# Patient Record
Sex: Female | Born: 1965 | Race: White | Hispanic: No | Marital: Married | Smoking: Never smoker
Health system: Southern US, Community
[De-identification: ages and names within clinical notes are randomized; demographics above are authoritative.]

## PROBLEM LIST (undated history)

## (undated) DIAGNOSIS — E785 Hyperlipidemia, unspecified: Secondary | ICD-10-CM

## (undated) DIAGNOSIS — I10 Essential (primary) hypertension: Secondary | ICD-10-CM

## (undated) HISTORY — PX: NO PAST SURGERIES: SHX2092

---

## 1997-11-26 ENCOUNTER — Other Ambulatory Visit: Admission: RE | Admit: 1997-11-26 | Discharge: 1997-11-26 | Payer: Self-pay | Admitting: *Deleted

## 2005-07-08 ENCOUNTER — Ambulatory Visit: Payer: Self-pay | Admitting: Family Medicine

## 2007-07-14 ENCOUNTER — Encounter: Payer: Self-pay | Admitting: Family Medicine

## 2010-08-12 NOTE — Letter (Signed)
Summary: rpc chart  rpc chart   Imported By: Curtis Sites 04/22/2010 17:19:45  _____________________________________________________________________  External Attachment:    Type:   Image     Comment:   External Document

## 2014-10-25 ENCOUNTER — Ambulatory Visit (INDEPENDENT_AMBULATORY_CARE_PROVIDER_SITE_OTHER): Payer: Self-pay | Admitting: Orthopedic Surgery

## 2014-10-25 VITALS — BP 125/83 | Ht 64.0 in | Wt 292.0 lb

## 2014-10-25 DIAGNOSIS — M1711 Unilateral primary osteoarthritis, right knee: Secondary | ICD-10-CM

## 2014-10-25 DIAGNOSIS — S83241A Other tear of medial meniscus, current injury, right knee, initial encounter: Secondary | ICD-10-CM

## 2014-10-25 NOTE — Progress Notes (Signed)
Patient ID: Jaclyn Klein, female   DOB: 05-11-1966, 49 y.o.   MRN: [id number] Subjective:    Jaclyn Klein is a 49 y.o. female who presents with knee pain involving the right knee. Onset was gradual, starting about several weeks ago. Inciting event: none known. Current symptoms include: crepitus sensation and pain located MEDIAL JOINT LINE . Pain is aggravated by any weight bearing, going up and down stairs, rising after sitting, standing and walking. Patient has had no prior knee problems. 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 Treatment to date: avoidance of offending activity and OTC analgesics which are not very effective.  No past medical history on file.  No past surgical history on file.  No family history on file.  Social History History  Substance Use Topics  . Smoking status: Not on file  . Smokeless tobacco: Not on file  . Alcohol Use: Not on file    Allergies not on file  Current Outpatient Prescriptions  Medication Sig Dispense Refill  . atorvastatin (LIPITOR) 20 MG tablet Take 20 mg by mouth daily.    Marland Kitchen. levothyroxine (SYNTHROID, LEVOTHROID) 50 MCG tablet Take 50 mcg by mouth daily before breakfast.    . lisinopril-hydrochlorothiazide (PRINZIDE,ZESTORETIC) 20-12.5 MG per tablet Take 2 tablets by mouth daily.    . Norethindrone (LYZA PO) Take by mouth.     No current facility-administered medications for this visit.      Review of Systems Constitutional: negative for fevers Integument/breast: negative for rash Musculoskeletal:negative for back pain Neurological: negative for paresthesia   Objective:    BP 125/83 mmHg  Ht 5\' 4"  (1.626 m)  Wt 292 lb (132.45 kg)  BMI 50.10 kg/m2 GENERAL NORMAL GROOMING  ORIENTATION NORMAL  MOOD NORMAL   UPPEREXTREMITIES: NORMAL   Right knee: positive exam findings: crepitus, medial joint line tenderness and ROM = 125 and negative exam findings: no effusion, no erythema, ACL stable, PCL  stable, MCL stable, LCL stable, no patellar laxity, McMurray's negative and no crepitus  Left knee:  normal and no effusion, full active range of motion, no joint line tenderness, ligamentous structures intact.   SKIN NORMAL   CV NORMAL   SENSATION NORMAL   COORDINATION BALANCE NORMAL     Assessment:   X-rays were from an outside facility on a disc and I could not get the axial view to show but the report reads severe patellofemoral arthritis the medial compartment had very minimal arthritis Plan:  The patient does not have insurance and is a self-pay she is interested in giving her cost down  I would love to get an MRI this knee. However we settled on an injection and see how that does first.  I think she has a torn medial meniscus patellofemoral arthritis is not symptomatic  Procedure note right knee injection verbal consent was obtained to inject right knee joint  Timeout was completed to confirm the site of injection  The medications used were 40 mg of Depo-Medrol and 1% lidocaine 3 cc  Anesthesia was provided by ethyl chloride and the skin was prepped with alcohol.  After cleaning the skin with alcohol a 20-gauge needle was used to inject the right knee joint. There were no complications. A sterile bandage was applied.

## 2014-10-25 NOTE — Patient Instructions (Signed)
Joint Injection  Care After  Refer to this sheet in the next few days. These instructions provide you with information on caring for yourself after you have had a joint injection. Your caregiver also may give you more specific instructions. Your treatment has been planned according to current medical practices, but problems sometimes occur. Call your caregiver if you have any problems or questions after your procedure.  After any type of joint injection, it is not uncommon to experience:  · Soreness, swelling, or bruising around the injection site.  · Mild numbness, tingling, or weakness around the injection site caused by the numbing medicine used before or with the injection.  It also is possible to experience the following effects associated with the specific agent after injection:  · Iodine-based contrast agents:  ¨ Allergic reaction (itching, hives, widespread redness, and swelling beyond the injection site).  · Corticosteroids (These effects are rare.):  ¨ Allergic reaction.  ¨ Increased blood sugar levels (If you have diabetes and you notice that your blood sugar levels have increased, notify your caregiver).  ¨ Increased blood pressure levels.  ¨ Mood swings.  · Hyaluronic acid in the use of viscosupplementation.  ¨ Temporary heat or redness.  ¨ Temporary rash and itching.  ¨ Increased fluid accumulation in the injected joint.  These effects all should resolve within a day after your procedure.   HOME CARE INSTRUCTIONS  · Limit yourself to light activity the day of your procedure. Avoid lifting heavy objects, bending, stooping, or twisting.  · Take prescription or over-the-counter pain medication as directed by your caregiver.  · You may apply ice to your injection site to reduce pain and swelling the day of your procedure. Ice may be applied 03-04 times:  ¨ Put ice in a plastic bag.  ¨ Place a towel between your skin and the bag.  ¨ Leave the ice on for no longer than 15-20 minutes each time.  SEEK  IMMEDIATE MEDICAL CARE IF:   · Pain and swelling get worse rather than better or extend beyond the injection site.  · Numbness does not go away.  · Blood or fluid continues to leak from the injection site.  · You have chest pain.  · You have swelling of your face or tongue.  · You have trouble breathing or you become dizzy.  · You develop a fever, chills, or severe tenderness at the injection site that last longer than 1 day.  MAKE SURE YOU:  · Understand these instructions.  · Watch your condition.  · Get help right away if you are not doing well or if you get worse.  Document Released: 03/12/2011 Document Revised: 09/21/2011 Document Reviewed: 03/12/2011  ExitCare® Patient Information ©2015 [employer]. This information is not intended to replace advice given to you by your health care provider. Make sure you discuss any questions you have with your health care provider.

## 2015-01-09 ENCOUNTER — Telehealth: Payer: Self-pay | Admitting: Orthopedic Surgery

## 2015-01-09 NOTE — Telephone Encounter (Signed)
Patient called to inquire about MRI, as per office visit 10/25/14.  States still has no insurance, but has payment "set aside." She is also checking with [facility] billing, and with the new [facility].  She will call back when ready to schedule, or we may hear from the [facility] representative, in regard to scheduling.  Patient has recently been married, last name change from "Aliene AltesByrne" to "Roorda."  Updated.  Pt's ph# is [phone]

## 2015-01-22 ENCOUNTER — Telehealth: Payer: Self-pay | Admitting: Orthopedic Surgery

## 2015-01-22 NOTE — Telephone Encounter (Signed)
Sent HIM/ROI fax as per request to patient's primary care, [facility], ph# [phone]; Fax# [phone]. Patient aware.

## 2015-01-22 NOTE — Telephone Encounter (Signed)
Patient called back and relayed that she would like to proceed with MRI, per last office visit note.  She is aware of self-pay requirement.  Please schedule.  Pt's ph# is [phone].

## 2015-01-23 ENCOUNTER — Other Ambulatory Visit: Payer: Self-pay | Admitting: *Deleted

## 2015-01-23 DIAGNOSIS — S83241A Other tear of medial meniscus, current injury, right knee, initial encounter: Secondary | ICD-10-CM

## 2015-02-05 ENCOUNTER — Ambulatory Visit (HOSPITAL_COMMUNITY)
Admission: RE | Admit: 2015-02-05 | Discharge: 2015-02-05 | Disposition: A | Discharge status: Home / Self Care | Payer: Self-pay | Source: Ambulatory Visit | Attending: Orthopedic Surgery | Admitting: Orthopedic Surgery

## 2015-02-05 DIAGNOSIS — S83241A Other tear of medial meniscus, current injury, right knee, initial encounter: Secondary | ICD-10-CM

## 2015-02-05 DIAGNOSIS — M2241 Chondromalacia patellae, right knee: Secondary | ICD-10-CM | POA: Insufficient documentation

## 2015-02-05 DIAGNOSIS — M23203 Derangement of unspecified medial meniscus due to old tear or injury, right knee: Secondary | ICD-10-CM | POA: Insufficient documentation

## 2015-02-05 DIAGNOSIS — M25561 Pain in right knee: Secondary | ICD-10-CM | POA: Insufficient documentation

## 2015-02-12 ENCOUNTER — Telehealth: Payer: Self-pay | Admitting: Orthopedic Surgery

## 2015-02-12 ENCOUNTER — Other Ambulatory Visit: Payer: Self-pay | Admitting: *Deleted

## 2015-02-12 MED ORDER — ACETAMINOPHEN-CODEINE #3 300-30 MG PO TABS: 1.0000 | ORAL_TABLET | Freq: Four times a day (QID) | ORAL | Status: DC | PRN

## 2015-02-12 MED ORDER — NAPROXEN 500 MG PO TABS: 500.0000 mg | ORAL_TABLET | Freq: Two times a day (BID) | ORAL | Status: DC

## 2015-02-12 NOTE — Telephone Encounter (Signed)
Patient returned call in response to Dr Harrison's message regarding her MRI results.  Please advise if appointment is needed, or please call at ph #[phone]

## 2015-02-12 NOTE — Telephone Encounter (Signed)
MRI - grade 4 OA TORN MED MENISCUS  IMPRESSION: 1. Complex tear of the midbody of the lateral meniscus with medial joint space narrowing and at multi focal grade 4 chondromalacia in the medial compartment. 2. 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 3. Slight chondromalacia of the lateral compartment.     Electronically Signed   By: Francene Boyers M.D.   On: 02/06/2015 08:10  NAPROSYN 500 BID # 60 TYLENOL # 3 - 1 Q6 PRN PAIN # 60

## 2015-02-12 NOTE — Telephone Encounter (Signed)
Left message

## 2015-03-05 ENCOUNTER — Other Ambulatory Visit: Payer: Self-pay | Admitting: Pain Medicine

## 2015-03-05 ENCOUNTER — Other Ambulatory Visit: Payer: Self-pay | Admitting: Orthopedic Surgery

## 2015-03-07 ENCOUNTER — Other Ambulatory Visit: Payer: Self-pay | Admitting: *Deleted

## 2015-03-07 MED ORDER — ACETAMINOPHEN-CODEINE #3 300-30 MG PO TABS: 1.0000 | ORAL_TABLET | Freq: Four times a day (QID) | ORAL | Status: DC | PRN

## 2015-03-13 ENCOUNTER — Other Ambulatory Visit: Payer: Self-pay | Admitting: Orthopedic Surgery

## 2015-03-19 ENCOUNTER — Other Ambulatory Visit: Payer: Self-pay | Admitting: *Deleted

## 2015-03-19 MED ORDER — NAPROXEN 500 MG PO TABS: 500.0000 mg | ORAL_TABLET | Freq: Two times a day (BID) | ORAL | Status: DC

## 2015-04-15 ENCOUNTER — Other Ambulatory Visit: Payer: Self-pay | Admitting: Orthopedic Surgery

## 2015-04-22 ENCOUNTER — Other Ambulatory Visit: Payer: Self-pay | Admitting: *Deleted

## 2015-04-22 MED ORDER — NAPROXEN 500 MG PO TABS: 500.0000 mg | ORAL_TABLET | Freq: Two times a day (BID) | ORAL | Status: DC

## 2015-04-25 NOTE — Telephone Encounter (Signed)
Patient is calling asking for a refill on TYLENOL # 3 - 1 Q6 PRN PAIN # 60 please advise?

## 2015-04-25 NOTE — Telephone Encounter (Signed)
OK  I THOUGHT I GOT THIS BEFORE   SHE CANT HAVE ANY MORE   ADVISE TYLENOL OR IBUPROFEN

## 2015-05-28 ENCOUNTER — Telehealth: Payer: Self-pay | Admitting: *Deleted

## 2015-07-23 ENCOUNTER — Ambulatory Visit: Payer: Self-pay | Admitting: Orthopedic Surgery

## 2015-08-06 ENCOUNTER — Ambulatory Visit (INDEPENDENT_AMBULATORY_CARE_PROVIDER_SITE_OTHER): Payer: Self-pay | Admitting: Orthopedic Surgery

## 2015-08-06 VITALS — BP 129/87 | Ht 64.0 in | Wt 277.0 lb

## 2015-08-06 DIAGNOSIS — M1711 Unilateral primary osteoarthritis, right knee: Secondary | ICD-10-CM

## 2015-08-06 DIAGNOSIS — S83241D Other tear of medial meniscus, current injury, right knee, subsequent encounter: Secondary | ICD-10-CM

## 2015-08-06 NOTE — Progress Notes (Signed)
Chief Complaint  Patient presents with  . Knee Pain    eval Rt knee, MRI results   I saw Jaclyn Klein back in 2016 she had a torn medial meniscus we discussed possible surgery but she was uninsured. She is still uninsured but did get a discount for her MRI which showed she had a torn medial meniscus and osteoarthritis of the knee. She also had some patellofemoral disease.  Prior history  Jaclyn Klein is a 50 y.o. female who presents with knee pain involving the right knee. Onset was gradual, starting about several weeks ago. Inciting event: none known. Current symptoms include: crepitus sensation and pain located MEDIAL JOINT LINE . Pain is aggravated by any weight bearing, going up and down stairs, rising after sitting, standing and walking. Patient has had no prior knee problems. 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 Treatment to date: avoidance of offending activity and OTC analgesics which are not very effective.  She reports now that her pain is worse her activity level has decreased and she wants her life back, she says she can't run and play with her granddaughter and she can't dance  Medical history of reflux hyperthyroidism hyperlipidemia hypertension  She reported no surgical history  She has a negative family history  Social history Social History  Substance Use Topics  . Smoking status: Not on file  . Smokeless tobacco: Not on file  . Alcohol Use: Not on file   BP 129/87 mmHg  Ht  (1.626 m)  Wt 277 lb (125.646 kg)  BMI 47.52 kg/m2 Gen. appearance normal grooming oriented 3 mood affect normal  She has medial joint line tenderness knee flexion 125 knee stable on range of motion mild crepitance in the patellofemoral joint painful patellofemoral compression no joint effusion. Strength normal in the right knee  Left knee normal without effusion no tenderness full range of motion ligamentous structures intact  Skin normal both  legs  Hip range of motion normal  Cardiovascular function normal sensation normal both legs  Balance normal  Data  MRI complex tear medial meniscus medial joint space narrowing multiple focal grade 4 chondromalacia medial compartment extensive grade 4 chondral malacia patella slight chondromalacia lateral compartment  Plain films show degenerative arthritis with joint space narrowing  The patient would like to try to have surgery and pain for herself  She is a torn medial meniscus was osteoarthritis medial compartment patellofemoral compartment  She understands she will have postoperative aching from her arthritis but should have improvement in overall knee function  The patient is scheduled for arthroscopy of the right knee and partial medial meniscectomy at her convenience

## 2015-08-06 NOTE — Patient Instructions (Signed)
Surgery- RIGHT KNEE ARTHROSCOPY WITH MEDIAL MENISECTOMY- 08/22/15

## 2015-08-16 NOTE — Patient Instructions (Signed)
Jaclyn Klein  08/16/2015     @   Your procedure is scheduled on  08/22/2015   Report to [facility] at  945 A.M.  Call this number if you have problems the morning of surgery:  [phone]   Remember:  Do not eat food or drink liquids after midnight.  Take these medicines the morning of surgery with A SIP OF WATER nexium, levothyroxine, lisinopril.   Do not wear jewelry, make-up or nail polish.  Do not wear lotions, powders, or perfumes.  You may wear deodorant.  Do not shave 48 hours prior to surgery.  Men may shave face and neck.  Do not bring valuables to the hospital.  [facility] is not responsible for any belongings or valuables.  Contacts, dentures or bridgework may not be worn into surgery.  Leave your suitcase in the car.  After surgery it may be brought to your room.  For patients admitted to the hospital, discharge time will be determined by your treatment team.  Patients discharged the day of surgery will not be allowed to drive home.   Name and phone number of your driver:   family Special instructions:  none  Please read over the following fact sheets that you were given. Coughing and Deep Breathing, Surgical Site Infection Prevention, Anesthesia Post-op Instructions and Care and Recovery After Surgery      Knee Arthroscopy Knee arthroscopy is a surgical procedure that is used to examine the inside of your knee joint and repair any damage. The surgeon puts a small, lighted instrument with a camera on the tip (arthroscope) through a small incision in your knee. The camera sends pictures to a monitor in the operating room. Your surgeon uses those pictures to guide the surgical instruments through other incisions to the area of damage. Knee arthroscopy can be used to treat many types of knee problems. It may be used:  To repair a torn ligament.  To repair or remove damaged tissue.  To remove a fluid-filled sac (cyst) from your  knee. LET [facility] CARE PROVIDER KNOW ABOUT:  Any allergies you have.  All medicines you are taking, including vitamins, herbs, eye drops, creams, and over-the-counter medicines.  Previous problems you or members of your family have had with the use of anesthetics.  Any blood disorders you have.  Previous surgeries you have had.  Any medical conditions you may have. RISKS AND COMPLICATIONS Generally, this is a safe procedure. However, problems may occur, including:  Infection.  Bleeding.  Damage to blood vessels, nerves, or structures of your knee.  A blood clot that forms in your leg and travels to your lung.  Failure to relieve symptoms. BEFORE THE PROCEDURE  Ask your health care provider about:  Changing or stopping your regular medicines. This is especially important if you are taking diabetes medicines or blood thinners.  Taking medicines such as aspirin and ibuprofen. These medicines can thin your blood. Do not take these medicines before your procedure if your health care provider instructs you not to.  Follow your health care provider's instructions about eating or drinking restrictions.  Plan to have someone take you home after the procedure.  If you go home right after the procedure, plan to have someone with you for 24 hours.  Do not drink alcohol unless your health care provider says that you can.  Do not use any tobacco products, including cigarettes, chewing tobacco, or electronic cigarettes unless  your health care provider says that you can. If you need help quitting, ask your health care provider.  You may have a physical exam. PROCEDURE  An IV tube will be inserted into one of your veins.  You will be given one or more of the following:  A medicine that helps you relax (sedative).  A medicine that numbs the area (local anesthetic).  A medicine that makes you fall asleep (general anesthetic).  A medicine that is injected into your spine that  numbs the area below and slightly above the injection site (spinal anesthetic).  A medicine that is injected into an area of your body that numbs everything below the injection site (regional anesthetic).  A cuff may be placed around your upper leg to slow bleeding during the procedure.  The surgeon will make a small number of incisions around your knee.  Your knee joint will be flushed and filled with a germ-free (sterile) solution.  The arthroscope will be passed through an incision into your knee joint.  More instruments will be passed through other incisions to repair your knee as needed.  The fluid will be removed from your knee.  The incisions will be closed with adhesive strips or stitches (sutures).  A bandage (dressing) will be placed over your knee. The procedure may vary among health care providers and hospitals. AFTER THE PROCEDURE  Your blood pressure, heart rate, breathing rate and blood oxygen level will be monitored often until the medicines you were given have worn off.  You may be given medicine for pain.  You may get crutches to help you walk without using your knee to support your body weight.  You may have to wear compression stockings. These stocking help to prevent blood clots and reduce swelling in your legs.   This information is not intended to replace advice given to you by your health care provider. Make sure you discuss any questions you have with your health care provider.   Document Released: 06/26/2000 Document Revised: 11/13/2014 Document Reviewed: 06/25/2014 Elsevier Interactive Patient Education 2016 Elsevier Inc. Knee Arthroscopy, Care After Refer to this sheet in the next few weeks. These instructions provide you with information about caring for yourself after your procedure. Your health care provider may also give you more specific instructions. Your treatment has been planned according to current medical practices, but problems sometimes  occur. Call your health care provider if you have any problems or questions after your procedure. WHAT TO EXPECT AFTER THE PROCEDURE After your procedure, it is common to have:  Soreness.  Pain. HOME CARE INSTRUCTIONS Bathing  Do not take baths, swim, or use a hot tub until your health care provider approves. Incision Care  There are many different ways to close and cover an incision, including stitches, skin glue, and adhesive strips. Follow your health care provider's instructions about:  Incision care.  Bandage (dressing) changes and removal.  Incision closure removal.  Check your incision area every day for signs of infection. Watch for:  Redness, swelling, or pain.  Fluid, blood, or pus. Activity  Avoid strenuous activities for as long as directed by your health care provider.  Return to your normal activities as directed by your health care provider. Ask your health care provider what activities are safe for you.  Perform range-of-motion exercises only as directed by your health care provider.  Do not lift anything that is heavier than 10 lb (4.5 kg).  Do not drive or operate heavy machinery while taking  pain medicine.  If you were given crutches, use them as directed by your health care provider. Managing Pain, Stiffness, and Swelling  If directed, apply ice to the injured area:  Put ice in a plastic bag.  Place a towel between your skin and the bag.  Leave the ice on for 20 minutes, 2-3 times per day.  Raise the injured area above the level of your heart while you are sitting or lying down as directed by your health care provider. General Instructions  Keep all follow-up visits as directed by your health care provider. This is important.  Take medicines only as directed by your health care provider.  Do not use any tobacco products, including cigarettes, chewing tobacco, or electronic cigarettes. If you need help quitting, ask your health care  provider.  If you were given compression stockings, wear them as directed by your health care provider. These stockings help prevent blood clots and reduce swelling in your legs. SEEK MEDICAL CARE IF:  You have severe pain with any movement of your knee.  You notice a bad smell coming from the incision or dressing.  You have redness, swelling, or pain at the site of your incision.  You have fluid, blood, or pus coming from your incision. SEEK IMMEDIATE MEDICAL CARE IF:  You develop a rash.  You have a fever.  You have difficulty breathing or have shortness of breath.  You develop pain in your calves or in the back of your knee.  You develop chest pain.  You develop numbness or tingling in your leg or foot.   This information is not intended to replace advice given to you by your health care provider. Make sure you discuss any questions you have with your health care provider.   Document Released: 01/16/2005 Document Revised: 11/13/2014 Document Reviewed: 06/25/2014 Elsevier Interactive Patient Education 2016 Elsevier Inc. PATIENT INSTRUCTIONS POST-ANESTHESIA  IMMEDIATELY FOLLOWING SURGERY:  Do not drive or operate machinery for the first twenty four hours after surgery.  Do not make any important decisions for twenty four hours after surgery or while taking narcotic pain medications or sedatives.  If you develop intractable nausea and vomiting or a severe headache please notify your doctor immediately.  FOLLOW-UP:  Please make an appointment with your surgeon as instructed. You do not need to follow up with anesthesia unless specifically instructed to do so.  WOUND CARE INSTRUCTIONS (if applicable):  Keep a dry clean dressing on the anesthesia/puncture wound site if there is drainage.  Once the wound has quit draining you may leave it open to air.  Generally you should leave the bandage intact for twenty four hours unless there is drainage.  If the epidural site drains for more  than 36-48 hours please call the anesthesia department.  QUESTIONS?:  Please feel free to call your physician or the hospital operator if you have any questions, and they will be happy to assist you.

## 2015-08-19 ENCOUNTER — Encounter (HOSPITAL_COMMUNITY)
Admission: RE | Admit: 2015-08-19 | Discharge: 2015-08-19 | Disposition: A | Discharge status: Home / Self Care | Payer: Self-pay | Source: Ambulatory Visit | Attending: Orthopedic Surgery | Admitting: Orthopedic Surgery

## 2015-08-19 ENCOUNTER — Other Ambulatory Visit: Payer: Self-pay

## 2015-08-19 ENCOUNTER — Encounter (HOSPITAL_COMMUNITY): Payer: Self-pay

## 2015-08-19 DIAGNOSIS — Z01810 Encounter for preprocedural cardiovascular examination: Secondary | ICD-10-CM | POA: Insufficient documentation

## 2015-08-19 DIAGNOSIS — S83241A Other tear of medial meniscus, current injury, right knee, initial encounter: Secondary | ICD-10-CM | POA: Insufficient documentation

## 2015-08-19 DIAGNOSIS — X58XXXA Exposure to other specified factors, initial encounter: Secondary | ICD-10-CM | POA: Insufficient documentation

## 2015-08-19 DIAGNOSIS — Z01812 Encounter for preprocedural laboratory examination: Secondary | ICD-10-CM | POA: Insufficient documentation

## 2015-08-19 HISTORY — DX: Hyperlipidemia, unspecified: E78.5

## 2015-08-19 HISTORY — DX: Essential (primary) hypertension: I10

## 2015-08-19 LAB — CBC
HCT: 44.5 % (ref 36.0–46.0)
HEMOGLOBIN: 15.5 g/dL — AB (ref 12.0–15.0)
MCH: 33 pg (ref 26.0–34.0)
MCHC: 34.8 g/dL (ref 30.0–36.0)
MCV: 94.7 fL (ref 78.0–100.0)
Platelets: 187 10*3/uL (ref 150–400)
RBC: 4.7 MIL/uL (ref 3.87–5.11)
RDW: 13.7 % (ref 11.5–15.5)
WBC: 9 10*3/uL (ref 4.0–10.5)

## 2015-08-19 LAB — BASIC METABOLIC PANEL
Anion gap: 10 (ref 5–15)
BUN: 18 mg/dL (ref 6–20)
CHLORIDE: 101 mmol/L (ref 101–111)
CO2: 25 mmol/L (ref 22–32)
CREATININE: 0.92 mg/dL (ref 0.44–1.00)
Calcium: 9 mg/dL (ref 8.9–10.3)
GFR calc Af Amer: >60 mL/min (ref >60)
GFR calc non Af Amer: >60 mL/min (ref >60)
GLUCOSE: 117 mg/dL — AB (ref 65–99)
Potassium: 4 mmol/L (ref 3.5–5.1)
SODIUM: 136 mmol/L (ref 135–145)

## 2015-08-19 LAB — HCG, SERUM, QUALITATIVE: PREG SERUM: POSITIVE — AB

## 2015-08-20 ENCOUNTER — Encounter (HOSPITAL_COMMUNITY)
Admission: RE | Admit: 2015-08-20 | Discharge: 2015-08-20 | Disposition: A | Discharge status: Home / Self Care | Payer: Self-pay | Source: Ambulatory Visit | Attending: Orthopedic Surgery | Admitting: Orthopedic Surgery

## 2015-08-20 ENCOUNTER — Encounter (HOSPITAL_COMMUNITY): Payer: Self-pay

## 2015-08-20 DIAGNOSIS — S83241D Other tear of medial meniscus, current injury, right knee, subsequent encounter: Secondary | ICD-10-CM | POA: Insufficient documentation

## 2015-08-20 DIAGNOSIS — Z01812 Encounter for preprocedural laboratory examination: Secondary | ICD-10-CM | POA: Insufficient documentation

## 2015-08-20 DIAGNOSIS — X58XXXD Exposure to other specified factors, subsequent encounter: Secondary | ICD-10-CM | POA: Insufficient documentation

## 2015-08-20 LAB — HCG, QUANTITATIVE, PREGNANCY: HCG, BETA CHAIN, QUANT, S: 4 m[IU]/mL (ref <5)

## 2015-08-22 ENCOUNTER — Encounter (HOSPITAL_COMMUNITY)
Admission: RE | Disposition: A | Discharge status: Home / Self Care | Payer: Self-pay | Source: Ambulatory Visit | Attending: Orthopedic Surgery

## 2015-08-22 ENCOUNTER — Ambulatory Visit (HOSPITAL_COMMUNITY): Payer: Self-pay | Admitting: Anesthesiology

## 2015-08-22 ENCOUNTER — Ambulatory Visit (HOSPITAL_COMMUNITY)
Admission: RE | Admit: 2015-08-22 | Discharge: 2015-08-22 | Disposition: A | Discharge status: Home / Self Care | Payer: Self-pay | Source: Ambulatory Visit | Attending: Orthopedic Surgery | Admitting: Orthopedic Surgery

## 2015-08-22 ENCOUNTER — Encounter (HOSPITAL_COMMUNITY): Payer: Self-pay | Admitting: *Deleted

## 2015-08-22 DIAGNOSIS — I10 Essential (primary) hypertension: Secondary | ICD-10-CM | POA: Insufficient documentation

## 2015-08-22 DIAGNOSIS — M23203 Derangement of unspecified medial meniscus due to old tear or injury, right knee: Secondary | ICD-10-CM | POA: Insufficient documentation

## 2015-08-22 DIAGNOSIS — M94261 Chondromalacia, right knee: Secondary | ICD-10-CM | POA: Insufficient documentation

## 2015-08-22 DIAGNOSIS — Z79899 Other long term (current) drug therapy: Secondary | ICD-10-CM | POA: Insufficient documentation

## 2015-08-22 DIAGNOSIS — E785 Hyperlipidemia, unspecified: Secondary | ICD-10-CM | POA: Insufficient documentation

## 2015-08-22 DIAGNOSIS — M1711 Unilateral primary osteoarthritis, right knee: Secondary | ICD-10-CM

## 2015-08-22 DIAGNOSIS — Z6841 Body Mass Index (BMI) 40.0 and over, adult: Secondary | ICD-10-CM | POA: Insufficient documentation

## 2015-08-22 DIAGNOSIS — M23329 Other meniscus derangements, posterior horn of medial meniscus, unspecified knee: Secondary | ICD-10-CM | POA: Insufficient documentation

## 2015-08-22 DIAGNOSIS — K219 Gastro-esophageal reflux disease without esophagitis: Secondary | ICD-10-CM | POA: Insufficient documentation

## 2015-08-22 DIAGNOSIS — M23321 Other meniscus derangements, posterior horn of medial meniscus, right knee: Secondary | ICD-10-CM

## 2015-08-22 HISTORY — PX: KNEE ARTHROSCOPY WITH MEDIAL MENISECTOMY: SHX5651

## 2015-08-22 SURGERY — ARTHROSCOPY, KNEE, WITH MEDIAL MENISCECTOMY
Anesthesia: General | Laterality: Right

## 2015-08-22 MED ORDER — EPINEPHRINE HCL 1 MG/ML IJ SOLN
INTRAMUSCULAR | Status: AC
  Filled 2015-08-22: qty 4

## 2015-08-22 MED ORDER — MIDAZOLAM HCL 5 MG/5ML IJ SOLN
INTRAMUSCULAR | Status: DC | PRN
  Administered 2015-08-22 (×2): 2 mg via INTRAVENOUS

## 2015-08-22 MED ORDER — GLYCOPYRROLATE 0.2 MG/ML IJ SOLN
0.2000 mg | Freq: Once | INTRAMUSCULAR | Status: AC
  Administered 2015-08-22: 0.2 mg via INTRAVENOUS

## 2015-08-22 MED ORDER — ONDANSETRON HCL 4 MG/2ML IJ SOLN: 4.0000 mg | Freq: Once | INTRAMUSCULAR | Status: DC | PRN

## 2015-08-22 MED ORDER — MIDAZOLAM HCL 2 MG/2ML IJ SOLN
INTRAMUSCULAR | Status: AC
  Filled 2015-08-22: qty 2

## 2015-08-22 MED ORDER — ROCURONIUM BROMIDE 100 MG/10ML IV SOLN
INTRAVENOUS | Status: DC | PRN
  Administered 2015-08-22: 20 mg via INTRAVENOUS
  Administered 2015-08-22: 5 mg via INTRAVENOUS

## 2015-08-22 MED ORDER — SODIUM CHLORIDE 0.9 % IR SOLN
Status: DC | PRN
  Administered 2015-08-22: 500 mL

## 2015-08-22 MED ORDER — LIDOCAINE HCL 1 % IJ SOLN
INTRAMUSCULAR | Status: DC | PRN
  Administered 2015-08-22: 35 mg via INTRADERMAL

## 2015-08-22 MED ORDER — SUCCINYLCHOLINE CHLORIDE 20 MG/ML IJ SOLN
INTRAMUSCULAR | Status: AC
  Filled 2015-08-22: qty 1

## 2015-08-22 MED ORDER — CEFAZOLIN SODIUM 1-5 GM-% IV SOLN: 1.0000 g | Freq: Once | INTRAVENOUS | Status: DC

## 2015-08-22 MED ORDER — PROPOFOL 10 MG/ML IV BOLUS
INTRAVENOUS | Status: DC | PRN
  Administered 2015-08-22: 160 mg via INTRAVENOUS

## 2015-08-22 MED ORDER — FENTANYL CITRATE (PF) 100 MCG/2ML IJ SOLN: 25.0000 ug | INTRAMUSCULAR | Status: DC | PRN

## 2015-08-22 MED ORDER — BUPIVACAINE-EPINEPHRINE (PF) 0.5% -1:200000 IJ SOLN
INTRAMUSCULAR | Status: DC | PRN
  Administered 2015-08-22: 60 mL

## 2015-08-22 MED ORDER — ONDANSETRON HCL 4 MG/2ML IJ SOLN
INTRAMUSCULAR | Status: AC
  Filled 2015-08-22: qty 2

## 2015-08-22 MED ORDER — FENTANYL CITRATE (PF) 250 MCG/5ML IJ SOLN
INTRAMUSCULAR | Status: AC
  Filled 2015-08-22: qty 5

## 2015-08-22 MED ORDER — PROPOFOL 10 MG/ML IV BOLUS
INTRAVENOUS | Status: AC
  Filled 2015-08-22: qty 20

## 2015-08-22 MED ORDER — FENTANYL CITRATE (PF) 100 MCG/2ML IJ SOLN
INTRAMUSCULAR | Status: DC | PRN
  Administered 2015-08-22 (×4): 50 ug via INTRAVENOUS

## 2015-08-22 MED ORDER — ROCURONIUM BROMIDE 50 MG/5ML IV SOLN
INTRAVENOUS | Status: AC
  Filled 2015-08-22: qty 1

## 2015-08-22 MED ORDER — DEXTROSE 5 % IV SOLN
3.0000 g | INTRAVENOUS | Status: DC | PRN
  Administered 2015-08-22: 3 g via INTRAVENOUS

## 2015-08-22 MED ORDER — GLYCOPYRROLATE 0.2 MG/ML IJ SOLN
INTRAMUSCULAR | Status: AC
  Filled 2015-08-22: qty 1

## 2015-08-22 MED ORDER — HYDROCODONE-ACETAMINOPHEN 10-325 MG PO TABS: 1.0000 | ORAL_TABLET | ORAL | Status: DC | PRN

## 2015-08-22 MED ORDER — GLYCOPYRROLATE 0.2 MG/ML IJ SOLN
INTRAMUSCULAR | Status: DC | PRN
  Administered 2015-08-22: .5 mg via INTRAVENOUS

## 2015-08-22 MED ORDER — LIDOCAINE HCL (PF) 1 % IJ SOLN
INTRAMUSCULAR | Status: AC
  Filled 2015-08-22: qty 5

## 2015-08-22 MED ORDER — MIDAZOLAM HCL 2 MG/2ML IJ SOLN
1.0000 mg | INTRAMUSCULAR | Status: DC | PRN
  Administered 2015-08-22 (×2): 2 mg via INTRAVENOUS

## 2015-08-22 MED ORDER — SODIUM CHLORIDE 0.9 % IR SOLN
Status: DC | PRN
  Administered 2015-08-22 (×3): 3000 mL

## 2015-08-22 MED ORDER — SUCCINYLCHOLINE CHLORIDE 20 MG/ML IJ SOLN
INTRAMUSCULAR | Status: DC | PRN
  Administered 2015-08-22: 175 mg via INTRAVENOUS

## 2015-08-22 MED ORDER — ONDANSETRON HCL 4 MG/2ML IJ SOLN
4.0000 mg | Freq: Once | INTRAMUSCULAR | Status: AC
  Administered 2015-08-22: 4 mg via INTRAVENOUS

## 2015-08-22 MED ORDER — CHLORHEXIDINE GLUCONATE 4 % EX LIQD: 60.0000 mL | Freq: Once | CUTANEOUS | Status: DC

## 2015-08-22 MED ORDER — CEFAZOLIN SODIUM 1-5 GM-% IV SOLN
INTRAVENOUS | Status: AC
  Filled 2015-08-22: qty 50

## 2015-08-22 MED ORDER — NEOSTIGMINE METHYLSULFATE 10 MG/10ML IV SOLN
INTRAVENOUS | Status: DC | PRN
  Administered 2015-08-22: 3 mg via INTRAVENOUS

## 2015-08-22 MED ORDER — GLYCOPYRROLATE 0.2 MG/ML IJ SOLN
INTRAMUSCULAR | Status: AC
  Filled 2015-08-22: qty 3

## 2015-08-22 MED ORDER — CEFAZOLIN SODIUM-DEXTROSE 2-3 GM-% IV SOLR
INTRAVENOUS | Status: AC
  Filled 2015-08-22: qty 50

## 2015-08-22 MED ORDER — DEXTROSE 5 % IV SOLN: 3.0000 g | INTRAVENOUS | Status: DC

## 2015-08-22 MED ORDER — BUPIVACAINE-EPINEPHRINE (PF) 0.5% -1:200000 IJ SOLN
INTRAMUSCULAR | Status: AC
Start: 2015-08-22 — End: 2015-08-22
  Filled 2015-08-22: qty 60

## 2015-08-22 MED ORDER — LACTATED RINGERS IV SOLN
INTRAVENOUS | Status: DC
  Administered 2015-08-22 (×2): via INTRAVENOUS

## 2015-08-22 MED ORDER — CEFAZOLIN SODIUM-DEXTROSE 2-3 GM-% IV SOLR: 2.0000 g | INTRAVENOUS | Status: AC

## 2015-08-22 MED ORDER — PROMETHAZINE HCL 12.5 MG PO TABS: 12.5000 mg | ORAL_TABLET | Freq: Four times a day (QID) | ORAL | Status: DC | PRN

## 2015-08-22 SURGICAL SUPPLY — 56 items
ARTHROWAND PARAGON T2 (SURGICAL WAND)
BAG HAMPER (MISCELLANEOUS) ×3 IMPLANT
BANDAGE ELASTIC 6 VELCRO NS (GAUZE/BANDAGES/DRESSINGS) ×3 IMPLANT
BANDAGE GAUZE ELAST BULKY 4 IN (GAUZE/BANDAGES/DRESSINGS) ×2 IMPLANT
BLADE AGGRESSIVE PLUS 4.0 (BLADE) ×3 IMPLANT
BLADE SURG SZ11 CARB STEEL (BLADE) ×3 IMPLANT
CHLORAPREP W/TINT 26ML (MISCELLANEOUS) ×6 IMPLANT
CLOTH BEACON ORANGE TIMEOUT ST (SAFETY) ×3 IMPLANT
COOLER CRYO IC GRAV AND TUBE (ORTHOPEDIC SUPPLIES) ×3 IMPLANT
CUFF CRYO KNEE LG 20X31 COOLER (ORTHOPEDIC SUPPLIES) ×2 IMPLANT
CUFF CRYO KNEE18X23 MED (MISCELLANEOUS) IMPLANT
CUFF TOURNIQUET SINGLE 34IN LL (TOURNIQUET CUFF) IMPLANT
CUFF TOURNIQUET SINGLE 44IN (TOURNIQUET CUFF) ×2 IMPLANT
CUTTER ANGLED DBL BITE 4.5 (BURR) IMPLANT
DECANTER SPIKE VIAL GLASS SM (MISCELLANEOUS) ×6 IMPLANT
GAUZE SPONGE 4X4 12PLY STRL (GAUZE/BANDAGES/DRESSINGS) ×1 IMPLANT
GAUZE SPONGE 4X4 16PLY XRAY LF (GAUZE/BANDAGES/DRESSINGS) ×3 IMPLANT
GAUZE XEROFORM 5X9 LF (GAUZE/BANDAGES/DRESSINGS) ×3 IMPLANT
GLOVE BIOGEL PI IND STRL 7.0 (GLOVE) ×1 IMPLANT
GLOVE BIOGEL PI INDICATOR 7.0 (GLOVE) ×4
GLOVE SKINSENSE NS SZ8.0 LF (GLOVE) ×2
GLOVE SKINSENSE STRL SZ8.0 LF (GLOVE) ×1 IMPLANT
GLOVE SS N UNI LF 8.5 STRL (GLOVE) ×3 IMPLANT
GOWN STRL REUS W/ TWL LRG LVL3 (GOWN DISPOSABLE) ×1 IMPLANT
GOWN STRL REUS W/TWL LRG LVL3 (GOWN DISPOSABLE) ×3
GOWN STRL REUS W/TWL XL LVL3 (GOWN DISPOSABLE) ×3 IMPLANT
HLDR LEG FOAM (MISCELLANEOUS) ×1 IMPLANT
IV NS IRRIG 3000ML ARTHROMATIC (IV SOLUTION) ×8 IMPLANT
KIT BLADEGUARD II DBL (SET/KITS/TRAYS/PACK) ×3 IMPLANT
KIT ROOM TURNOVER AP CYSTO (KITS) ×3 IMPLANT
LEG HOLDER FOAM (MISCELLANEOUS) ×2
MANIFOLD NEPTUNE II (INSTRUMENTS) ×3 IMPLANT
MARKER SKIN DUAL TIP RULER LAB (MISCELLANEOUS) ×3 IMPLANT
NDL HYPO 18GX1.5 BLUNT FILL (NEEDLE) ×1 IMPLANT
NDL HYPO 21X1.5 SAFETY (NEEDLE) ×1 IMPLANT
NDL SPNL 18GX3.5 QUINCKE PK (NEEDLE) ×1 IMPLANT
NEEDLE HYPO 18GX1.5 BLUNT FILL (NEEDLE) ×3 IMPLANT
NEEDLE HYPO 21X1.5 SAFETY (NEEDLE) ×3 IMPLANT
NEEDLE SPNL 18GX3.5 QUINCKE PK (NEEDLE) ×3 IMPLANT
NS IRRIG 1000ML POUR BTL (IV SOLUTION) ×3 IMPLANT
PACK ARTHRO LIMB DRAPE STRL (MISCELLANEOUS) ×3 IMPLANT
PAD ABD 5X9 TENDERSORB (GAUZE/BANDAGES/DRESSINGS) ×3 IMPLANT
PAD ARMBOARD 7.5X6 YLW CONV (MISCELLANEOUS) ×3 IMPLANT
PADDING CAST COTTON 6X4 STRL (CAST SUPPLIES) ×3 IMPLANT
PADDING WEBRIL 6 STERILE (GAUZE/BANDAGES/DRESSINGS) ×2 IMPLANT
SET ARTHROSCOPY INST (INSTRUMENTS) ×3 IMPLANT
SET ARTHROSCOPY PUMP TUBE (IRRIGATION / IRRIGATOR) ×3 IMPLANT
SET BASIN LINEN APH (SET/KITS/TRAYS/PACK) ×3 IMPLANT
SPONGE GAUZE 4X4 12PLY (GAUZE/BANDAGES/DRESSINGS) ×2 IMPLANT
SUT ETHILON 3 0 FSL (SUTURE) IMPLANT
SYR 30ML LL (SYRINGE) ×3 IMPLANT
SYRINGE 10CC LL (SYRINGE) ×3 IMPLANT
WAND 50 DEG COVAC W/CORD (SURGICAL WAND) IMPLANT
WAND 90 DEG TURBOVAC W/CORD (SURGICAL WAND) ×2 IMPLANT
WAND ARTHRO PARAGON T2 (SURGICAL WAND) IMPLANT
YANKAUER SUCT BULB TIP 10FT TU (MISCELLANEOUS) ×9 IMPLANT

## 2015-08-22 NOTE — Transfer of Care (Signed)
Immediate Anesthesia Transfer of Care Note  Patient: Jaclyn Klein  Procedure(s) Performed: Procedure(s): KNEE ARTHROSCOPY WITH MEDIAL MENISECTOMY, PATELLAR AND FEMUR CHONDROPLASTY (Right)  Patient Location: PACU  Anesthesia Type:General  Level of Consciousness: awake  Airway & Oxygen Therapy: Patient Spontanous Breathing and Patient connected to face mask oxygen  Post-op Assessment: Report given to RN  Post vital signs: Reviewed and stable  Last Vitals:  Filed Vitals:   08/22/15 1013 08/22/15 1014  BP:  119/73  Pulse:    Temp:    Resp: 17 19    Complications: No apparent anesthesia complications

## 2015-08-22 NOTE — Anesthesia Procedure Notes (Signed)
Procedure Name: Intubation Date/Time: 08/22/2015 10:35 AM Performed by: Despina Hidden Pre-anesthesia Checklist: Emergency Drugs available, Patient identified, Suction available and Patient being monitored Patient Re-evaluated:Patient Re-evaluated prior to inductionOxygen Delivery Method: Circle system utilized Preoxygenation: Pre-oxygenation with 100% oxygen Intubation Type: IV induction, Rapid sequence and Cricoid Pressure applied Laryngoscope Size: Mac and 3 Grade View: Grade III Tube type: Oral Tube size: 7.0 mm Number of attempts: 1 Airway Equipment and Method: Stylet and Oral airway Placement Confirmation: ETT inserted through vocal cords under direct vision,  positive ETCO2 and breath sounds checked- equal and bilateral Secured at: 22 cm Tube secured with: Tape

## 2015-08-22 NOTE — Interval H&P Note (Signed)
History and Physical Interval Note:  08/22/2015 10:05 AM  Jaclyn Klein  has presented today for surgery, with the diagnosis of right medial meniscus tear  The various methods of treatment have been discussed with the patient and family. After consideration of risks, benefits and other options for treatment, the patient has consented to  Procedure(s): KNEE ARTHROSCOPY WITH MEDIAL MENISECTOMY (Right) as a surgical intervention .  The patient's history has been reviewed, patient examined, no change in status, stable for surgery.  I have reviewed the patient's chart and labs.  Questions were answered to the patient's satisfaction.     Fuller Canada

## 2015-08-22 NOTE — Op Note (Signed)
08/22/2015  11:31 AM  PATIENT:  Jaclyn Klein  50 y.o. 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   Delay start of Pharmacological VTE agent (>24hrs) due to surgical blood loss or risk of bleeding: not applicable  The surgery was done as follows  The patient was identified in the preop area the right knee was confirmed as a surgical site. Chart update was completed. Patient was taken to surgery.  Gen. anesthesia was administered the patient was in supine position in the right leg was prepped and draped sterilely. The arthroscopic leg holder was used to stabilize her right leg and a pad was placed under the left leg  After timeout a lateral portal was placed scope was placed in the joint and a diagnostic arthroscopy was completed with circumferential tour around the joint twice  A medial portal was established the  medial meniscus was probed and then the tear was resected back to stable rim of meniscal fragments were removed and the meniscus was contoured with an 90 ArthroCare wand. Postresection probing show the meniscus to be stable chondroplasty was performed over the medial femoral condyle. This was done and light dusting fashion.  We then turned our attention to the patella or a brief chondroplasty was performed to remove crabmeat material.  The joint was irrigated and cleaned suctioned of any debris portals were closed with 3-0 nylon suture joint was injected with 60 mL of Marcaine with epinephrine  Sterile dressing applied Cryo/Cuff were applied and activated patient extubated taken recovery in stable condition  Postop plan Weightbearing as tolerated with a walker Active range of motion exercises postop day 2 Change dressing postop day 1 Physical therapy will be ordered when she comes to the office

## 2015-08-22 NOTE — Anesthesia Postprocedure Evaluation (Signed)
Anesthesia Post Note  Patient: Jaclyn Klein  Procedure(s) Performed: Procedure(s) (LRB): KNEE ARTHROSCOPY WITH MEDIAL MENISECTOMY, PATELLAR AND FEMUR CHONDROPLASTY (Right)  Patient location during evaluation: Short Stay Anesthesia Type: General Level of consciousness: awake and alert and oriented Pain management: pain level controlled Vital Signs Assessment: post-procedure vital signs reviewed and stable Respiratory status: spontaneous breathing Cardiovascular status: blood pressure returned to baseline Postop Assessment: no signs of nausea or vomiting Anesthetic complications: no    Last Vitals:  Filed Vitals:   08/22/15 1215 08/22/15 1227  BP:  121/80  Pulse: 77 79  Temp:    Resp: 12 15    Last Pain:  Filed Vitals:   08/22/15 1232  PainSc: 3                  Talyn Eddie

## 2015-08-22 NOTE — Anesthesia Preprocedure Evaluation (Addendum)
Anesthesia Evaluation  Patient identified by MRN, date of birth, ID band Patient awake    Reviewed: Allergy & Precautions, NPO status , Patient's Chart, lab work & pertinent test results  Airway Mallampati: II  TM Distance: >3 FB Neck ROM: Full    Dental  (+) Teeth Intact   Pulmonary neg pulmonary ROS,    breath sounds clear to auscultation       Cardiovascular hypertension, Pt. on medications  Rhythm:Regular Rate:Normal     Neuro/Psych    GI/Hepatic GERD  Medicated and Controlled,  Endo/Other  Hypothyroidism Morbid obesity  Renal/GU      Musculoskeletal   Abdominal   Peds  Hematology   Anesthesia Other Findings   Reproductive/Obstetrics                           Anesthesia Physical Anesthesia Plan  ASA: II  Anesthesia Plan: General   Post-op Pain Management:    Induction: Intravenous, Rapid sequence and Cricoid pressure planned  Airway Management Planned: Oral ETT  Additional Equipment:   Intra-op Plan:   Post-operative Plan: Extubation in OR  Informed Consent: I have reviewed the patients History and Physical, chart, labs and discussed the procedure including the risks, benefits and alternatives for the proposed anesthesia with the patient or authorized representative who has indicated his/her understanding and acceptance.     Plan Discussed with:   Anesthesia Plan Comments:         Anesthesia Quick Evaluation

## 2015-08-22 NOTE — H&P (View-Only) (Signed)
Chief Complaint  Patient presents with  . Knee Pain    eval Rt knee, MRI results   I saw [name] back in 2016 she had a torn medial meniscus we discussed possible surgery but she was uninsured. She is still uninsured but did get a discount for her MRI which showed she had a torn medial meniscus and osteoarthritis of the knee. She also had some patellofemoral disease.  Prior history  Jaclyn Klein is a 50 y.o. female who presents with knee pain involving the right knee. Onset was gradual, starting about several weeks ago. Inciting event: none known. Current symptoms include: crepitus sensation and pain located MEDIAL JOINT LINE . Pain is aggravated by any weight bearing, going up and down stairs, rising after sitting, standing and walking. Patient has had no prior knee problems. 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 Treatment to date: avoidance of offending activity and OTC analgesics which are not very effective.  She reports now that her pain is worse her activity level has decreased and she wants her life back, she says she can't run and play with her granddaughter and she can't dance  Medical history of reflux hyperthyroidism hyperlipidemia hypertension  She reported no surgical history  She has a negative family history  Social history Social History  Substance Use Topics  . Smoking status: Not on file  . Smokeless tobacco: Not on file  . Alcohol Use: Not on file   BP 129/87 mmHg  Ht  (1.626 m)  Wt 277 lb (125.646 kg)  BMI 47.52 kg/m2 Gen. appearance normal grooming oriented 3 mood affect normal  She has medial joint line tenderness knee flexion 125 knee stable on range of motion mild crepitance in the patellofemoral joint painful patellofemoral compression no joint effusion. Strength normal in the right knee  Left knee normal without effusion no tenderness full range of motion ligamentous structures intact  Skin normal both  legs  Hip range of motion normal  Cardiovascular function normal sensation normal both legs  Balance normal  Data  MRI complex tear medial meniscus medial joint space narrowing multiple focal grade 4 chondromalacia medial compartment extensive grade 4 chondral malacia patella slight chondromalacia lateral compartment  Plain films show degenerative arthritis with joint space narrowing  The patient would like to try to have surgery and pain for herself  She is a torn medial meniscus was osteoarthritis medial compartment patellofemoral compartment  She understands she will have postoperative aching from her arthritis but should have improvement in overall knee function  The patient is scheduled for arthroscopy of the right knee and partial medial meniscectomy at her convenience

## 2015-08-22 NOTE — Brief Op Note (Signed)
08/22/2015  11:31 AM  PATIENT:  Jaclyn Klein  50 y.o. 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   Delay start of Pharmacological VTE agent (>24hrs) due to surgical blood loss or risk of bleeding: not applicable  The surgery was done as follows  The patient was identified in the preop area the right knee was confirmed as a surgical site. Chart update was completed. Patient was taken to surgery.  Gen. anesthesia was administered the patient was in supine position in the right leg was prepped and draped sterilely. The arthroscopic leg holder was used to stabilize her right leg and a pad was placed under the left leg  After timeout a lateral portal was placed scope was placed in the joint and a diagnostic arthroscopy was completed with circumferential tour around the joint twice  A medial portal was established the  medial meniscus was probed and then the tear was resected back to stable rim of meniscal fragments were removed and the meniscus was contoured with an 90 ArthroCare wand. Postresection probing show the meniscus to be stable chondroplasty was performed over the medial femoral condyle. This was done and light dusting fashion.  We then turned our attention to the patella or a brief chondroplasty was performed to remove crabmeat material.  The joint was irrigated and cleaned suctioned of any debris portals were closed with 3-0 nylon suture joint was injected with 60 mL of Marcaine with epinephrine  Sterile dressing applied Cryo/Cuff were applied and activated patient extubated taken recovery in stable condition  Postop plan Weightbearing as tolerated with a walker Active range of motion exercises postop day 2 Change dressing postop day 1 Physical therapy will be ordered when she comes to the office  

## 2015-08-23 ENCOUNTER — Encounter (HOSPITAL_COMMUNITY): Payer: Self-pay | Admitting: Orthopedic Surgery

## 2015-08-26 ENCOUNTER — Ambulatory Visit (INDEPENDENT_AMBULATORY_CARE_PROVIDER_SITE_OTHER): Payer: Self-pay | Admitting: Orthopedic Surgery

## 2015-08-26 ENCOUNTER — Encounter: Payer: Self-pay | Admitting: Orthopedic Surgery

## 2015-08-26 VITALS — BP 111/44 | Ht 64.0 in | Wt 277.0 lb

## 2015-08-26 DIAGNOSIS — Z9889 Other specified postprocedural states: Secondary | ICD-10-CM

## 2015-08-26 DIAGNOSIS — Z4789 Encounter for other orthopedic aftercare: Secondary | ICD-10-CM

## 2015-08-26 MED ORDER — HYDROCODONE-ACETAMINOPHEN 7.5-325 MG PO TABS: 1.0000 | ORAL_TABLET | Freq: Four times a day (QID) | ORAL | Status: DC | PRN

## 2015-08-26 NOTE — Patient Instructions (Addendum)
Home therapy  Give temp handicap sticker   Generic Knee Exercises EXERCISES hold exercise for 2 seconds, do 15 reps of each twice a day  RANGE OF MOTION (ROM) AND STRETCHING EXERCISES These exercises may help you when beginning to rehabilitate your injury. Your symptoms may resolve with or without further involvement from your physician, physical therapist, or athletic trainer. While completing these exercises, remember:   Restoring tissue flexibility helps normal motion to return to the joints. This allows healthier, less painful movement and activity.  An effective stretch should be held for at least 30 seconds.  A stretch should never be painful. You should only feel a gentle lengthening or release in the stretched tissue. STRETCH - Knee Extension, Prone  Lie on your stomach on a firm surface, such as a bed or countertop. Place your right / left knee and leg just beyond the edge of the surface. You may wish to place a towel under the far end of your right / left thigh for comfort.  Relax your leg muscles and allow gravity to straighten your knee. Your clinician may advise you to add an ankle weight if more resistance is helpful for you.  You should feel a stretch in the back of your right / left knee. Hold this position for __________ seconds. Repeat __________ times. Complete this stretch __________ times per day. * Your physician, physical therapist, or athletic trainer may ask you to add ankle weight to enhance your stretch.  RANGE OF MOTION - Knee Flexion, Active  Lie on your back with both knees straight. (If this causes back discomfort, bend your opposite knee, placing your foot flat on the floor.)  Slowly slide your heel back toward your buttocks until you feel a gentle stretch in the front of your knee or thigh.  Hold for __________ seconds. Slowly slide your heel back to the starting position. Repeat __________ times. Complete this exercise __________ times per day.    STRETCH - Quadriceps, Prone   Lie on your stomach on a firm surface, such as a bed or padded floor.  Bend your right / left knee and grasp your ankle. If you are unable to reach your ankle or pant leg, use a belt around your foot to lengthen your reach.  Gently pull your heel toward your buttocks. Your knee should not slide out to the side. You should feel a stretch in the front of your thigh and/or knee.  Hold this position for __________ seconds. Repeat __________ times. Complete this stretch __________ times per day.  STRETCH - Hamstrings, Supine   Lie on your back. Loop a belt or towel over the ball of your right / left foot.  Straighten your right / left knee and slowly pull on the belt to raise your leg. Do not allow the right / left knee to bend. Keep your opposite leg flat on the floor.  Raise the leg until you feel a gentle stretch behind your right / left knee or thigh. Hold this position for __________ seconds. Repeat __________ times. Complete this stretch __________ times per day.  STRENGTHENING EXERCISES These exercises may help you when beginning to rehabilitate your injury. They may resolve your symptoms with or without further involvement from your physician, physical therapist, or athletic trainer. While completing these exercises, remember:   Muscles can gain both the endurance and the strength needed for everyday activities through controlled exercises.  Complete these exercises as instructed by your physician, physical therapist, or athletic trainer. Progress  the resistance and repetitions only as guided.  You may experience muscle soreness or fatigue, but the pain or discomfort you are trying to eliminate should never worsen during these exercises. If this pain does worsen, stop and make certain you are following the directions exactly. If the pain is still present after adjustments, discontinue the exercise until you can discuss the trouble with your  clinician. STRENGTH - Quadriceps, Isometrics  Lie on your back with your right / left leg extended and your opposite knee bent.  Gradually tense the muscles in the front of your right / left thigh. You should see either your knee cap slide up toward your hip or increased dimpling just above the knee. This motion will push the back of the knee down toward the floor/mat/bed on which you are lying.  Hold the muscle as tight as you can without increasing your pain for __________ seconds.  Relax the muscles slowly and completely in between each repetition. Repeat __________ times. Complete this exercise __________ times per day.  STRENGTH - Quadriceps, Short Arcs   Lie on your back. Place a __________ inch towel roll under your knee so that the knee slightly bends.  Raise only your lower leg by tightening the muscles in the front of your thigh. Do not allow your thigh to rise.  Hold this position for __________ seconds. Repeat __________ times. Complete this exercise __________ times per day.  OPTIONAL ANKLE WEIGHTS: Begin with ____________________, but DO NOT exceed ____________________. Increase in 1 pound/0.5 kilogram increments.  STRENGTH - Quadriceps, Straight Leg Raises  Quality counts! Watch for signs that the quadriceps muscle is working to insure you are strengthening the correct muscles and not "cheating" by substituting with healthier muscles.  Lay on your back with your right / left leg extended and your opposite knee bent.  Tense the muscles in the front of your right / left thigh. You should see either your knee cap slide up or increased dimpling just above the knee. Your thigh may even quiver.  Tighten these muscles even more and raise your leg 4 to 6 inches off the floor. Hold for __________ seconds.  Keeping these muscles tense, lower your leg.  Relax the muscles slowly and completely in between each repetition. Repeat __________ times. Complete this exercise __________  times per day.  STRENGTH - Hamstring, Curls  Lay on your stomach with your legs extended. (If you lay on a bed, your feet may hang over the edge.)  Tighten the muscles in the back of your thigh to bend your right / left knee up to 90 degrees. Keep your hips flat on the bed/floor.  Hold this position for __________ seconds.  Slowly lower your leg back to the starting position. Repeat __________ times. Complete this exercise __________ times per day.  OPTIONAL ANKLE WEIGHTS: Begin with ____________________, but DO NOT exceed ____________________. Increase in 1 pound/0.5 kilogram increments.  STRENGTH - Quadriceps, Squats  Stand in a door frame so that your feet and knees are in line with the frame.  Use your hands for balance, not support, on the frame.  Slowly lower your weight, bending at the hips and knees. Keep your lower legs upright so that they are parallel with the door frame. Squat only within the range that does not increase your knee pain. Never let your hips drop below your knees.  Slowly return upright, pushing with your legs, not pulling with your hands. Repeat __________ times. Complete this exercise __________ times per day.  STRENGTH - Quadriceps, Wall Slides  Follow guidelines for form closely. Increased knee pain often results from poorly placed feet or knees.  Lean against a smooth wall or door and walk your feet out 18-24 inches. Place your feet hip-width apart.  Slowly slide down the wall or door until your knees bend __________ degrees.* Keep your knees over your heels, not your toes, and in line with your hips, not falling to either side.  Hold for __________ seconds. Stand up to rest for __________ seconds in between each repetition. Repeat __________ times. Complete this exercise __________ times per day. * Your physician, physical therapist, or athletic trainer will alter this angle based on your symptoms and progress.   This information is not intended to  replace advice given to you by your health care provider. Make sure you discuss any questions you have with your health care provider.   Document Released: 05/13/2005 Document Revised: 07/20/2014 Document Reviewed: 10/11/2008 Elsevier Interactive Patient Education [employer]

## 2015-08-26 NOTE — Progress Notes (Signed)
Patient ID: Jaclyn Klein, female   DOB: 06-19-1966, 50 y.o.   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 Follow-up    POST OP 1, SARK, DOS 08/22/15     BP 111/44 mmHg  Ht  (1.626 m)  Wt 277 lb (125.646 kg)  BMI 47.52 kg/m2  LMP  (LMP Unknown)  The surgical sites look clean dry and intact.  Handicap sticker temporary  ASSESSMENT AND PLAN   Start physical therapy at home and arrange follow-up visit 4 weeks  Weightbearing as tolerated exercises given

## 2015-09-23 ENCOUNTER — Ambulatory Visit: Payer: Self-pay | Admitting: Orthopedic Surgery

## 2015-11-04 ENCOUNTER — Encounter: Payer: Self-pay | Admitting: General Surgery

## 2017-01-19 NOTE — Telephone Encounter (Signed)
NA

## 2019-04-20 ENCOUNTER — Other Ambulatory Visit: Payer: Self-pay

## 2019-04-20 DIAGNOSIS — Z20822 Contact with and (suspected) exposure to covid-19: Secondary | ICD-10-CM

## 2019-04-21 LAB — NOVEL CORONAVIRUS, NAA: SARS-CoV-2, NAA: DETECTED — AB

## 2019-04-26 ENCOUNTER — Other Ambulatory Visit: Payer: Self-pay | Admitting: *Deleted

## 2019-04-26 DIAGNOSIS — Z20822 Contact with and (suspected) exposure to covid-19: Secondary | ICD-10-CM

## 2019-04-29 LAB — NOVEL CORONAVIRUS, NAA: SARS-CoV-2, NAA: NOT DETECTED

## 2019-06-23 ENCOUNTER — Emergency Department (HOSPITAL_COMMUNITY)
Admission: EM | Admit: 2019-06-23 | Discharge: 2019-06-23 | Disposition: A | Discharge status: Home / Self Care | Payer: Self-pay | Attending: Emergency Medicine | Admitting: Emergency Medicine

## 2019-06-23 ENCOUNTER — Other Ambulatory Visit: Payer: Self-pay

## 2019-06-23 ENCOUNTER — Ambulatory Visit
Admission: EM | Admit: 2019-06-23 | Discharge: 2019-06-23 | Disposition: A | Discharge status: Home / Self Care | Payer: Self-pay

## 2019-06-23 ENCOUNTER — Encounter (HOSPITAL_COMMUNITY): Payer: Self-pay | Admitting: Emergency Medicine

## 2019-06-23 DIAGNOSIS — I10 Essential (primary) hypertension: Secondary | ICD-10-CM | POA: Insufficient documentation

## 2019-06-23 DIAGNOSIS — M79604 Pain in right leg: Secondary | ICD-10-CM

## 2019-06-23 DIAGNOSIS — L03115 Cellulitis of right lower limb: Secondary | ICD-10-CM | POA: Insufficient documentation

## 2019-06-23 DIAGNOSIS — M7989 Other specified soft tissue disorders: Secondary | ICD-10-CM

## 2019-06-23 LAB — COMPREHENSIVE METABOLIC PANEL
ALT: 37 U/L (ref 0–44)
AST: 36 U/L (ref 15–41)
Albumin: 3.2 g/dL — ABNORMAL LOW (ref 3.5–5.0)
Alkaline Phosphatase: 125 U/L (ref 38–126)
Anion gap: 13 (ref 5–15)
BUN: 15 mg/dL (ref 6–20)
CO2: 27 mmol/L (ref 22–32)
Calcium: 8.6 mg/dL — ABNORMAL LOW (ref 8.9–10.3)
Chloride: 95 mmol/L — ABNORMAL LOW (ref 98–111)
Creatinine, Ser: 0.82 mg/dL (ref 0.44–1.00)
GFR calc Af Amer: >60 mL/min (ref >60)
GFR calc non Af Amer: >60 mL/min (ref >60)
Glucose, Bld: 98 mg/dL (ref 70–99)
Potassium: 2.9 mmol/L — ABNORMAL LOW (ref 3.5–5.1)
Sodium: 135 mmol/L (ref 135–145)
Total Bilirubin: 1.8 mg/dL — ABNORMAL HIGH (ref 0.3–1.2)
Total Protein: 7.1 g/dL (ref 6.5–8.1)

## 2019-06-23 LAB — CBC WITH DIFFERENTIAL/PLATELET
Abs Immature Granulocytes: 0.06 10*3/uL (ref 0.00–0.07)
Basophils Absolute: 0 10*3/uL (ref 0.0–0.1)
Basophils Relative: 0 %
Eosinophils Absolute: 0.1 10*3/uL (ref 0.0–0.5)
Eosinophils Relative: 1 %
HCT: 45.5 % (ref 36.0–46.0)
Hemoglobin: 15.1 g/dL — ABNORMAL HIGH (ref 12.0–15.0)
Immature Granulocytes: 1 %
Lymphocytes Relative: 11 %
Lymphs Abs: 1.3 10*3/uL (ref 0.7–4.0)
MCH: 32.4 pg (ref 26.0–34.0)
MCHC: 33.2 g/dL (ref 30.0–36.0)
MCV: 97.6 fL (ref 80.0–100.0)
Monocytes Absolute: 0.9 10*3/uL (ref 0.1–1.0)
Monocytes Relative: 7 %
Neutro Abs: 9.5 10*3/uL — ABNORMAL HIGH (ref 1.7–7.7)
Neutrophils Relative %: 80 %
Platelets: 109 10*3/uL — ABNORMAL LOW (ref 150–400)
RBC: 4.66 MIL/uL (ref 3.87–5.11)
RDW: 14.5 % (ref 11.5–15.5)
WBC: 11.9 10*3/uL — ABNORMAL HIGH (ref 4.0–10.5)
nRBC: 0 % (ref 0.0–0.2)

## 2019-06-23 MED ORDER — DOXYCYCLINE HYCLATE 100 MG PO TABS
100.0000 mg | ORAL_TABLET | Freq: Once | ORAL | Status: AC
  Administered 2019-06-23: 100 mg via ORAL
  Filled 2019-06-23: qty 1

## 2019-06-23 MED ORDER — DOXYCYCLINE HYCLATE 100 MG PO CAPS: 100.0000 mg | ORAL_CAPSULE | Freq: Two times a day (BID) | ORAL | 0 refills | Status: DC

## 2019-06-23 NOTE — Discharge Instructions (Signed)
You were seen in the emergency department today with right leg pain and swelling.  I am treating you for a likely skin infection but would like for you to call the number below to schedule an appointment for ultrasound of your right leg tomorrow.  We need to do this to make sure there is not a blood clot there which would require different therapy and if missed could cause a pulmonary embolism.  Please return to the emergency department immediately with any chest pain, shortness of breath, sudden worsening symptoms.    IMPORTANT PATIENT INSTRUCTIONS:  Your ED provider has recommended an Outpatient Ultrasound.  Please call [phone] to schedule an appointment.  If your appointment is scheduled for a Saturday, Sunday or holiday, please go to the [facility] Emergency Department Registration Desk at least 15 minutes prior to your appointment time and tell them you are there for an ultrasound.    If your appointment is scheduled for a weekday (Monday-Friday), please go directly to the [facility] Radiology Department at least 15 minutes prior to your appointment time and tell them you are there for an ultrasound.  Please call [phone] with questions.

## 2019-06-23 NOTE — Discharge Instructions (Signed)
Recommending further evaluation and management in the ED.  Cannot rule out DVT in UC setting.  Patient aware and in agreement with plan.  Will go by private vehicle to [facility] ED.  Declines EMS transport.

## 2019-06-23 NOTE — ED Provider Notes (Signed)
Emergency Department Provider Note   I have reviewed the triage vital signs and the nursing notes.   HISTORY  Chief Complaint Leg Swelling   HPI Jaclyn Klein is a 53 y.o. female with past medical history of elevated BMI, hyperlipidemia, hypertension, presents to the emergency department for evaluation of right leg pain with redness and swelling.  Patient has no history of DVT.  She denies injury to the leg.  Redness began yesterday and has worsened.  She notes some sore throat with nausea but denies fever.  She did have COVID-19 detected in October on an outpatient test but was not having symptoms at that time.  She denies any chest pain.  She states that she has some baseline shortness of breath which does seem slightly worse over the past few weeks but nothing worsening in the last few days.    Past Medical History:  Diagnosis Date  . Hyperlipidemia   . Hypertension     Patient Active Problem List   Diagnosis Date Noted  . Medial meniscus, posterior horn derangement   . Primary osteoarthritis of right knee     Past Surgical History:  Procedure Laterality Date  . KNEE ARTHROSCOPY WITH MEDIAL MENISECTOMY Right 08/22/2015   Procedure: KNEE ARTHROSCOPY WITH MEDIAL MENISECTOMY, PATELLAR AND FEMUR CHONDROPLASTY;  Surgeon: Vickki Hearing, MD;  Location: [facility];  Service: Orthopedics;  Laterality: Right;  . NO PAST SURGERIES      Allergies Patient has no known allergies.  Family History  Problem Relation Age of Onset  . Healthy Mother   . Healthy Father     Social History Social History   Tobacco Use  . Smoking status: Never Smoker  . Smokeless tobacco: Never Used  Substance Use Topics  . Alcohol use: Yes    Alcohol/week: 14.0 standard drinks    Types: 14 Glasses of wine per week    Comment: 2 glasses to bottle a day  . Drug use: Never    Review of Systems  Constitutional: No fever/chills Eyes: No visual changes. ENT: Positive sore throat and  congestion.  Cardiovascular: Denies chest pain. Respiratory: SOB - chronic Gastrointestinal: No abdominal pain.  No nausea, no vomiting.  No diarrhea.  No constipation. Genitourinary: Negative for dysuria. Musculoskeletal: Negative for back pain. Skin: Right leg pain and swelling.  Neurological: Negative for headaches, focal weakness or numbness.  10-point ROS otherwise negative.  ____________________________________________   PHYSICAL EXAM:  VITAL SIGNS: ED Triage Vitals  Enc Vitals Group     BP 06/23/19 1827 104/83     Pulse Rate 06/23/19 1827 85     Resp 06/23/19 1827 18     Temp 06/23/19 1827 98.8 F (37.1 C)     Temp Source 06/23/19 1827 Oral     SpO2 06/23/19 1827 93 %     Weight 06/23/19 1828 300 lb (136.1 kg)     Height 06/23/19 1828 5\' 4"  (1.626 m)   Constitutional: Alert and oriented. Well appearing and in no acute distress. Eyes: Conjunctivae are normal.  Head: Atraumatic. Nose: No congestion/rhinnorhea. Mouth/Throat: Mucous membranes are moist.   Neck: No stridor.   Cardiovascular: Normal rate, regular rhythm. Good peripheral circulation. Grossly normal heart sounds.   Respiratory: Normal respiratory effort.  No retractions. Lungs CTAB. Gastrointestinal: No distention.  Musculoskeletal: Right lower leg with erythema and warmth.  No specific calf tenderness.  Patient has area of very superficial skin breakdown over the anterior, distal tib-fib. No abscess.  Neurologic:  Normal  speech and language.  Skin:  Skin is warm and dry. Warm, erythematous lower extremity.   ____________________________________________   LABS (all labs ordered are listed, but only abnormal results are displayed)  Labs Reviewed  CBC WITH DIFFERENTIAL/PLATELET - Abnormal; Notable for the following components:      Result Value   WBC 11.9 (*)    Hemoglobin 15.1 (*)    Platelets 109 (*)    Neutro Abs 9.5 (*)    All other components within normal limits  COMPREHENSIVE METABOLIC PANEL  - Abnormal; Notable for the following components:   Potassium 2.9 (*)    Chloride 95 (*)    Calcium 8.6 (*)    Albumin 3.2 (*)    Total Bilirubin 1.8 (*)    All other components within normal limits   ____________________________________________  RADIOLOGY  Patient left prior to CXR.  ____________________________________________   PROCEDURES  Procedure(s) performed:   Procedures  None  ____________________________________________   INITIAL IMPRESSION / ASSESSMENT AND PLAN / ED COURSE  Pertinent labs & imaging results that were available during my care of the patient were reviewed by me and considered in my medical decision making (see chart for details).   Patient presents to the emergency department for evaluation of right lower leg redness, swelling.  The exam is most consistent with cellulitis but also considering DVT is a possibility.  Patient has mild leukocytosis.  She abruptly asked to leave after her blood work was drawn.  Lab results not available at the time of discharge.  Her shortness of breath appears more chronic in nature and I doubt that she is dealing with PE.  She reportedly had Covid in October and recovered.  She is not hypoxic.  I have arranged for her to come back tomorrow for right lower extremity ultrasound to rule out DVT but did start her on doxycycline here in the emergency department and sent a prescription to her pharmacy for 7 days.    ____________________________________________  FINAL CLINICAL IMPRESSION(S) / ED DIAGNOSES  Final diagnoses:  Cellulitis of right leg     MEDICATIONS GIVEN DURING THIS VISIT:  Medications  doxycycline (VIBRA-TABS) tablet 100 mg (100 mg Oral Given 06/23/19 2117)     NEW OUTPATIENT MEDICATIONS STARTED DURING THIS VISIT:  Discharge Medication List as of 06/23/2019  9:17 PM    START taking these medications   Details  doxycycline (VIBRAMYCIN) 100 MG capsule Take 1 capsule (100 mg total) by mouth 2 (two)  times daily for 7 days., Starting Fri 06/23/2019, Until Fri 06/30/2019, Normal        Note:  This document was prepared using Dragon voice recognition software and may include unintentional dictation errors.  Nanda Quinton, MD, [facility]    Antino Mayabb, [facility], MD 06/23/19 2137

## 2019-06-23 NOTE — ED Provider Notes (Signed)
[place]   [id number] 06/23/19 Arrival Time: 0630  CC: RLE pain, swelling and redness  SUBJECTIVE: History from: patient. Jaclyn Klein is a 53 y.o. female complains of stable RLE pain, redness, and swelling x 1 day.  Denies a precipitating event or specific injury.  Localizes the pain to the RLE.  Describes the pain as constant and "uncomfortable" in character.  Has tried icing, elevation, and cleaning with peroxide with minimal relief.  Symptoms are made worse to the touch.  Denies similar symptoms in the past.  Complains of associated SOB.  Denies fever, chills, ecchymosis, weakness, numbness and tingling, CP, chest tightness, recent long travel, pregnancy, hx of malignancy, tobacco use, hormone use, hx of blood clots.    ROS: As per HPI.  All other pertinent ROS negative.     Past Medical History:  Diagnosis Date  . Hyperlipidemia   . Hypertension    Past Surgical History:  Procedure Laterality Date  . KNEE ARTHROSCOPY WITH MEDIAL MENISECTOMY Right 08/22/2015   Procedure: KNEE ARTHROSCOPY WITH MEDIAL MENISECTOMY, PATELLAR AND FEMUR CHONDROPLASTY;  Surgeon: Carole Civil, MD;  Location: [facility];  Service: Orthopedics;  Laterality: Right;  . NO PAST SURGERIES     No Known Allergies No current facility-administered medications on file prior to encounter.   Current Outpatient Medications on File Prior to Encounter  Medication Sig Dispense Refill  . ibuprofen (ADVIL) 800 MG tablet Take 800 mg by mouth 2 (two) times daily.    Marland Kitchen esomeprazole (NEXIUM) 40 MG capsule Take 40 mg by mouth daily at 12 noon.    Marland Kitchen levothyroxine (SYNTHROID, LEVOTHROID) 50 MCG tablet Take 50 mcg by mouth daily before breakfast.    . lisinopril-hydrochlorothiazide (PRINZIDE,ZESTORETIC) 20-12.5 MG per tablet Take 2 tablets by mouth daily.    Marland Kitchen lovastatin (MEVACOR) 20 MG tablet Take 20 mg by mouth at bedtime.    . naproxen sodium (ANAPROX) 220 MG tablet Take 220 mg by mouth 2 (two) times daily  with a meal.    . Norethindrone (LYZA PO) Take by mouth.    . promethazine (PHENERGAN) 12.5 MG tablet Take 1 tablet (12.5 mg total) by mouth every 6 (six) hours as needed for nausea or vomiting. 30 tablet 0   Social History   Socioeconomic History  . Marital status: Married    Spouse name: Not on file  . Number of children: Not on file  . Years of education: Not on file  . Highest education level: Not on file  Occupational History  . Not on file  Tobacco Use  . Smoking status: Never Smoker  Substance and Sexual Activity  . Alcohol use: Yes    Alcohol/week: 14.0 standard drinks    Types: 14 Glasses of wine per week    Comment: 2 glasses to bottle a day  . Drug use: Not on file  . Sexual activity: Yes  Other Topics Concern  . Not on file  Social History Narrative  . Not on file   Social Determinants of Health   Financial Resource Strain:   . Difficulty of Paying Living Expenses: Not on file  Food Insecurity:   . Worried About Charity fundraiser in the Last Year: Not on file  . Ran Out of Food in the Last Year: Not on file  Transportation Needs:   . Lack of Transportation (Medical): Not on file  . Lack of Transportation (Non-Medical): Not on file  Physical Activity:   . Days of Exercise per  Week: Not on file  . Minutes of Exercise per Session: Not on file  Stress:   . Feeling of Stress : Not on file  Social Connections:   . Frequency of Communication with Friends and Family: Not on file  . Frequency of Social Gatherings with Friends and Family: Not on file  . Attends Religious Services: Not on file  . Active Member of Clubs or Organizations: Not on file  . Attends Banker Meetings: Not on file  . Marital Status: Not on file  Intimate Partner Violence:   . Fear of Current or Ex-Partner: Not on file  . Emotionally Abused: Not on file  . Physically Abused: Not on file  . Sexually Abused: Not on file   Family History  Problem Relation Age of Onset  .  Healthy Mother   . Healthy Father     OBJECTIVE:  Vitals:   06/23/19 1546  BP: (!) 148/97  Pulse: 86  Resp: 18  Temp: 99.4 F (37.4 C)  TempSrc: Oral  SpO2: 92%    General appearance: ALERT; in no acute distress.  Head: NCAT Lungs: Normal respiratory effort; CTAB CV: RRR; unable to palpate dorsalis pedis pulse; cap refill 3 seconds Musculoskeletal: RT LE Inspection: Obvious erythema and swelling to RLE Palpation: TTP over posterior RLE and calf ROM: FROM active and passive Leg circumference: 45.5 cm upper RLE vs. 42 cm upper LLE; 29 cm lower RLE vs. 27 cm lower LLE Skin: warm and dry Neurologic: Ambulates with minimal difficulty; Sensation intact about the lower extremities Psychological: alert and cooperative; normal mood and affect   ASSESSMENT & PLAN:  1. Pain and swelling of right lower extremity     Recommending further evaluation and management in the ED.  Cannot rule out DVT in UC setting.  Patient aware and in agreement with plan.  Will go by private vehicle to [facility] ED.  Declines EMS transport.      Rennis Harding, PA-C 06/23/19 1642

## 2019-06-23 NOTE — ED Notes (Signed)
Pt requesting to leave stating, "I dont want to do the chest xray. I will follow up with my PCP." EDP aware.

## 2019-06-23 NOTE — ED Triage Notes (Addendum)
Pt presents to UC w/ c/o right lower leg swelling since yesterday. Pt's left lower leg is red and swollen. Pt states she has pain in calf when flexing foot up and down.

## 2019-06-23 NOTE — ED Triage Notes (Signed)
PT states she went to Urgent Care today for right lower leg swelling, redness and soreness x2 days with no injury. PT's leg is warm to the touch. PT also states they did a Covid test on her today due to sorethroat, diarrhea, nausea, decreased po intake and sinus drainage x4 days. PT states she was Covid positive but was asymptomatic in mid October this year.

## 2019-06-30 ENCOUNTER — Other Ambulatory Visit: Payer: Self-pay

## 2019-06-30 ENCOUNTER — Encounter (HOSPITAL_COMMUNITY): Payer: Self-pay | Admitting: Emergency Medicine

## 2019-06-30 ENCOUNTER — Ambulatory Visit (HOSPITAL_COMMUNITY)
Admission: RE | Admit: 2019-06-30 | Discharge: 2019-06-30 | Disposition: A | Discharge status: Home / Self Care | Payer: Self-pay | Source: Ambulatory Visit | Attending: Emergency Medicine | Admitting: Emergency Medicine

## 2019-06-30 ENCOUNTER — Inpatient Hospital Stay (HOSPITAL_COMMUNITY)
Admission: EM | Admit: 2019-06-30 | Discharge: 2019-07-03 | DRG: 603 | Disposition: A | Discharge status: Home / Self Care | Payer: Self-pay | Attending: Family Medicine | Admitting: Family Medicine

## 2019-06-30 ENCOUNTER — Emergency Department (HOSPITAL_COMMUNITY): Payer: Self-pay

## 2019-06-30 DIAGNOSIS — Z8619 Personal history of other infectious and parasitic diseases: Secondary | ICD-10-CM

## 2019-06-30 DIAGNOSIS — L03119 Cellulitis of unspecified part of limb: Secondary | ICD-10-CM

## 2019-06-30 DIAGNOSIS — L03115 Cellulitis of right lower limb: Principal | ICD-10-CM | POA: Diagnosis present

## 2019-06-30 DIAGNOSIS — Z20828 Contact with and (suspected) exposure to other viral communicable diseases: Secondary | ICD-10-CM | POA: Diagnosis present

## 2019-06-30 DIAGNOSIS — M79604 Pain in right leg: Secondary | ICD-10-CM | POA: Insufficient documentation

## 2019-06-30 DIAGNOSIS — M79606 Pain in leg, unspecified: Secondary | ICD-10-CM | POA: Diagnosis present

## 2019-06-30 DIAGNOSIS — L02415 Cutaneous abscess of right lower limb: Secondary | ICD-10-CM | POA: Diagnosis present

## 2019-06-30 DIAGNOSIS — E876 Hypokalemia: Secondary | ICD-10-CM | POA: Diagnosis present

## 2019-06-30 DIAGNOSIS — Z79899 Other long term (current) drug therapy: Secondary | ICD-10-CM

## 2019-06-30 DIAGNOSIS — U071 COVID-19: Secondary | ICD-10-CM | POA: Diagnosis present

## 2019-06-30 DIAGNOSIS — R05 Cough: Secondary | ICD-10-CM | POA: Diagnosis present

## 2019-06-30 DIAGNOSIS — M7989 Other specified soft tissue disorders: Secondary | ICD-10-CM | POA: Diagnosis present

## 2019-06-30 DIAGNOSIS — Z6841 Body Mass Index (BMI) 40.0 and over, adult: Secondary | ICD-10-CM

## 2019-06-30 DIAGNOSIS — E039 Hypothyroidism, unspecified: Secondary | ICD-10-CM | POA: Diagnosis present

## 2019-06-30 DIAGNOSIS — E785 Hyperlipidemia, unspecified: Secondary | ICD-10-CM | POA: Diagnosis present

## 2019-06-30 DIAGNOSIS — L02419 Cutaneous abscess of limb, unspecified: Secondary | ICD-10-CM | POA: Diagnosis present

## 2019-06-30 DIAGNOSIS — Z79890 Hormone replacement therapy (postmenopausal): Secondary | ICD-10-CM

## 2019-06-30 DIAGNOSIS — B954 Other streptococcus as the cause of diseases classified elsewhere: Secondary | ICD-10-CM | POA: Diagnosis present

## 2019-06-30 DIAGNOSIS — I10 Essential (primary) hypertension: Secondary | ICD-10-CM | POA: Diagnosis present

## 2019-06-30 LAB — CBC WITH DIFFERENTIAL/PLATELET
Abs Immature Granulocytes: 0.13 K/uL — ABNORMAL HIGH (ref 0.00–0.07)
Basophils Absolute: 0.1 K/uL (ref 0.0–0.1)
Basophils Relative: 1 %
Eosinophils Absolute: 0.1 K/uL (ref 0.0–0.5)
Eosinophils Relative: 1 %
HCT: 45.7 % (ref 36.0–46.0)
Hemoglobin: 15.1 g/dL — ABNORMAL HIGH (ref 12.0–15.0)
Immature Granulocytes: 1 %
Lymphocytes Relative: 16 %
Lymphs Abs: 1.6 K/uL (ref 0.7–4.0)
MCH: 32.3 pg (ref 26.0–34.0)
MCHC: 33 g/dL (ref 30.0–36.0)
MCV: 97.9 fL (ref 80.0–100.0)
Monocytes Absolute: 0.7 K/uL (ref 0.1–1.0)
Monocytes Relative: 7 %
Neutro Abs: 7.5 K/uL (ref 1.7–7.7)
Neutrophils Relative %: 74 %
Platelets: 179 K/uL (ref 150–400)
RBC: 4.67 MIL/uL (ref 3.87–5.11)
RDW: 14.2 % (ref 11.5–15.5)
WBC: 10 K/uL (ref 4.0–10.5)
nRBC: 0 % (ref 0.0–0.2)

## 2019-06-30 LAB — LACTIC ACID, PLASMA
Lactic Acid, Venous: 0.9 mmol/L (ref 0.5–1.9)
Lactic Acid, Venous: 1.2 mmol/L (ref 0.5–1.9)

## 2019-06-30 LAB — BASIC METABOLIC PANEL WITH GFR
Anion gap: 11 (ref 5–15)
BUN: 21 mg/dL — ABNORMAL HIGH (ref 6–20)
CO2: 33 mmol/L — ABNORMAL HIGH (ref 22–32)
Calcium: 8.9 mg/dL (ref 8.9–10.3)
Chloride: 96 mmol/L — ABNORMAL LOW (ref 98–111)
Creatinine, Ser: 0.79 mg/dL (ref 0.44–1.00)
GFR calc Af Amer: >60 mL/min
GFR calc non Af Amer: >60 mL/min
Glucose, Bld: 93 mg/dL (ref 70–99)
Potassium: 2.7 mmol/L — CL (ref 3.5–5.1)
Sodium: 140 mmol/L (ref 135–145)

## 2019-06-30 LAB — MAGNESIUM: Magnesium: 1.8 mg/dL (ref 1.7–2.4)

## 2019-06-30 LAB — GLUCOSE, CAPILLARY: Glucose-Capillary: 84 mg/dL (ref 70–99)

## 2019-06-30 LAB — TSH: TSH: 3.873 u[IU]/mL (ref 0.350–4.500)

## 2019-06-30 MED ORDER — ATORVASTATIN CALCIUM 40 MG PO TABS
80.0000 mg | ORAL_TABLET | Freq: Every day | ORAL | Status: DC
  Administered 2019-07-01 – 2019-07-03 (×3): 80 mg via ORAL
  Filled 2019-06-30 (×3): qty 2

## 2019-06-30 MED ORDER — LEVOTHYROXINE SODIUM 100 MCG PO TABS
100.0000 ug | ORAL_TABLET | Freq: Every day | ORAL | Status: DC
  Administered 2019-07-01 – 2019-07-03 (×3): 100 ug via ORAL
  Filled 2019-06-30 (×3): qty 1

## 2019-06-30 MED ORDER — LIDOCAINE-EPINEPHRINE (PF) 1 %-1:200000 IJ SOLN: 20.0000 mL | Freq: Once | INTRAMUSCULAR | Status: DC

## 2019-06-30 MED ORDER — LIDOCAINE HCL (PF) 2 % IJ SOLN: 10.0000 mL | Freq: Once | INTRAMUSCULAR | Status: DC

## 2019-06-30 MED ORDER — POTASSIUM CHLORIDE 10 MEQ/100ML IV SOLN
10.0000 meq | INTRAVENOUS | Status: AC
  Administered 2019-07-01 (×4): 10 meq via INTRAVENOUS
  Filled 2019-06-30 (×4): qty 100

## 2019-06-30 MED ORDER — LIDOCAINE HCL (PF) 1 % IJ SOLN
INTRAMUSCULAR | Status: AC
  Filled 2019-06-30: qty 30

## 2019-06-30 MED ORDER — POTASSIUM CHLORIDE 10 MEQ/100ML IV SOLN
10.0000 meq | INTRAVENOUS | Status: AC
  Administered 2019-06-30 (×2): 10 meq via INTRAVENOUS
  Filled 2019-06-30 (×2): qty 100

## 2019-06-30 MED ORDER — MAGNESIUM OXIDE 400 (241.3 MG) MG PO TABS
800.0000 mg | ORAL_TABLET | Freq: Once | ORAL | Status: AC
  Administered 2019-06-30: 800 mg via ORAL
  Filled 2019-06-30: qty 2

## 2019-06-30 MED ORDER — SODIUM CHLORIDE 0.9 % IV BOLUS
1000.0000 mL | Freq: Once | INTRAVENOUS | Status: AC
  Administered 2019-06-30: 1000 mL via INTRAVENOUS

## 2019-06-30 MED ORDER — PANTOPRAZOLE SODIUM 40 MG PO TBEC
40.0000 mg | DELAYED_RELEASE_TABLET | Freq: Every day | ORAL | Status: DC
  Administered 2019-07-01 – 2019-07-03 (×3): 40 mg via ORAL
  Filled 2019-06-30 (×3): qty 1

## 2019-06-30 MED ORDER — ENOXAPARIN SODIUM 80 MG/0.8ML ~~LOC~~ SOLN
70.0000 mg | SUBCUTANEOUS | Status: DC
  Administered 2019-06-30 – 2019-07-02 (×3): 70 mg via SUBCUTANEOUS
  Filled 2019-06-30 (×3): qty 0.8

## 2019-06-30 MED ORDER — IBUPROFEN 600 MG PO TABS
600.0000 mg | ORAL_TABLET | Freq: Four times a day (QID) | ORAL | Status: DC | PRN
  Administered 2019-07-01 – 2019-07-03 (×5): 600 mg via ORAL
  Filled 2019-06-30 (×6): qty 1

## 2019-06-30 MED ORDER — POTASSIUM CHLORIDE 20 MEQ PO PACK
20.0000 meq | PACK | Freq: Every day | ORAL | Status: AC
  Administered 2019-06-30 – 2019-07-02 (×3): 20 meq via ORAL
  Filled 2019-06-30 (×3): qty 1

## 2019-06-30 MED ORDER — INSULIN ASPART 100 UNIT/ML ~~LOC~~ SOLN
0.0000 [IU] | Freq: Three times a day (TID) | SUBCUTANEOUS | Status: DC
  Administered 2019-07-03: 3 [IU] via SUBCUTANEOUS

## 2019-06-30 MED ORDER — ONDANSETRON HCL 4 MG PO TABS: 4.0000 mg | ORAL_TABLET | Freq: Four times a day (QID) | ORAL | Status: DC | PRN

## 2019-06-30 MED ORDER — ENOXAPARIN SODIUM 40 MG/0.4ML ~~LOC~~ SOLN: 40.0000 mg | SUBCUTANEOUS | Status: DC

## 2019-06-30 MED ORDER — ACETAMINOPHEN 650 MG RE SUPP: 650.0000 mg | Freq: Four times a day (QID) | RECTAL | Status: DC | PRN

## 2019-06-30 MED ORDER — POVIDONE-IODINE 10 % EX SOLN
CUTANEOUS | Status: AC
  Filled 2019-06-30: qty 15

## 2019-06-30 MED ORDER — PROMETHAZINE HCL 12.5 MG PO TABS: 12.5000 mg | ORAL_TABLET | Freq: Four times a day (QID) | ORAL | Status: DC | PRN

## 2019-06-30 MED ORDER — VANCOMYCIN HCL 2000 MG/400ML IV SOLN
2000.0000 mg | Freq: Once | INTRAVENOUS | Status: AC
Start: 2019-06-30 — End: 2019-06-30
  Administered 2019-06-30: 2000 mg via INTRAVENOUS
  Filled 2019-06-30: qty 400

## 2019-06-30 MED ORDER — LISINOPRIL 10 MG PO TABS
20.0000 mg | ORAL_TABLET | Freq: Every day | ORAL | Status: DC
  Administered 2019-07-01 – 2019-07-03 (×3): 20 mg via ORAL
  Filled 2019-06-30 (×3): qty 2

## 2019-06-30 MED ORDER — POTASSIUM CHLORIDE CRYS ER 20 MEQ PO TBCR
40.0000 meq | EXTENDED_RELEASE_TABLET | Freq: Once | ORAL | Status: AC
Start: 2019-06-30 — End: 2019-06-30
  Administered 2019-06-30: 40 meq via ORAL
  Filled 2019-06-30: qty 2

## 2019-06-30 MED ORDER — POVIDONE-IODINE 5 % EX SOLN
Freq: Once | CUTANEOUS | Status: DC
  Filled 2019-06-30: qty 88.7

## 2019-06-30 MED ORDER — POLYETHYLENE GLYCOL 3350 17 G PO PACK: 17.0000 g | PACK | Freq: Every day | ORAL | Status: DC | PRN

## 2019-06-30 MED ORDER — VANCOMYCIN HCL 1250 MG/250ML IV SOLN
1250.0000 mg | Freq: Two times a day (BID) | INTRAVENOUS | Status: DC
  Administered 2019-07-01: 1250 mg via INTRAVENOUS
  Filled 2019-06-30: qty 250

## 2019-06-30 MED ORDER — ACETAMINOPHEN 325 MG PO TABS
650.0000 mg | ORAL_TABLET | Freq: Four times a day (QID) | ORAL | Status: DC | PRN
  Administered 2019-07-02: 650 mg via ORAL
  Filled 2019-06-30: qty 2

## 2019-06-30 MED ORDER — ONDANSETRON HCL 4 MG/2ML IJ SOLN: 4.0000 mg | Freq: Four times a day (QID) | INTRAMUSCULAR | Status: DC | PRN

## 2019-06-30 MED ORDER — IOHEXOL 300 MG/ML  SOLN
75.0000 mL | Freq: Once | INTRAMUSCULAR | Status: AC | PRN
  Administered 2019-06-30: 75 mL via INTRAVENOUS

## 2019-06-30 NOTE — ED Notes (Signed)
CRITICAL VALUE ALERT  Critical Value:  K 2.7  Date & Time Notied:  3568 12/18/  Provider Notified: Sedonia Small  Orders Received/Actions taken:

## 2019-06-30 NOTE — ED Provider Notes (Signed)
[facility] Emergency Department Provider Note MRN:  [id number]  Arrival date & time: 07/01/19     Chief Complaint   Cellulitis   History of Present Illness   Jaclyn Klein is a 53 y.o. year-old female with a history of hypertension, hyperlipidemia presenting to the ED with chief complaint of leg pain.  1 week of leg pain, swelling, redness.  Was also endorsing fevers and chills during her initial symptoms.  Was seen here in the emergency department 1 week ago, came back today for ultrasound which has revealed a fluid collection, possible abscess.  Also was told she has low potassium.  She denies headache, no vision change, no chest pain, no shortness of breath, no abdominal pain.  Pain is moderate in severity, constant, located in the right calf.  Review of Systems  A complete 10 system review of systems was obtained and all systems are negative except as noted in the HPI and PMH.   Patient's Health History    Past Medical History:  Diagnosis Date  . Hyperlipidemia   . Hypertension     Past Surgical History:  Procedure Laterality Date  . KNEE ARTHROSCOPY WITH MEDIAL MENISECTOMY Right 08/22/2015   Procedure: KNEE ARTHROSCOPY WITH MEDIAL MENISECTOMY, PATELLAR AND FEMUR CHONDROPLASTY;  Surgeon: Carole Civil, MD;  Location: [facility];  Service: Orthopedics;  Laterality: Right;  . NO PAST SURGERIES      Family History  Problem Relation Age of Onset  . Healthy Mother   . Healthy Father     Social History   Socioeconomic History  . Marital status: Married    Spouse name: Not on file  . Number of children: Not on file  . Years of education: Not on file  . Highest education level: Not on file  Occupational History  . Not on file  Tobacco Use  . Smoking status: Never Smoker  . Smokeless tobacco: Never Used  Substance and Sexual Activity  . Alcohol use: Yes    Alcohol/week: 14.0 standard drinks    Types: 14 Glasses of wine per week    Comment:  2 glasses to bottle a day  . Drug use: Never  . Sexual activity: Yes  Other Topics Concern  . Not on file  Social History Narrative  . Not on file   Social Determinants of Health   Financial Resource Strain:   . Difficulty of Paying Living Expenses: Not on file  Food Insecurity:   . Worried About Charity fundraiser in the Last Year: Not on file  . Ran Out of Food in the Last Year: Not on file  Transportation Needs:   . Lack of Transportation (Medical): Not on file  . Lack of Transportation (Non-Medical): Not on file  Physical Activity:   . Days of Exercise per Week: Not on file  . Minutes of Exercise per Session: Not on file  Stress:   . Feeling of Stress : Not on file  Social Connections:   . Frequency of Communication with Friends and Family: Not on file  . Frequency of Social Gatherings with Friends and Family: Not on file  . Attends Religious Services: Not on file  . Active Member of Clubs or Organizations: Not on file  . Attends Archivist Meetings: Not on file  . Marital Status: Not on file  Intimate Partner Violence:   . Fear of Current or Ex-Partner: Not on file  . Emotionally Abused: Not on file  . Physically  Abused: Not on file  . Sexually Abused: Not on file     Physical Exam  Vital Signs and Nursing Notes reviewed Vitals:   06/30/19 1738 06/30/19 2204  BP: (!) 155/114 (!) 142/105  Pulse: 73 77  Resp: 18 18  Temp:  97.8 F (36.6 C)  SpO2: 96% 95%    CONSTITUTIONAL: Well-appearing, NAD NEURO:  Alert and oriented x 3, no focal deficits EYES:  eyes equal and reactive ENT/NECK:  no LAD, no JVD CARDIO: Regular rate, well-perfused, normal S1 and S2 PULM:  CTAB no wheezing or rhonchi GI/GU:  normal bowel sounds, non-distended, non-tender MSK/SPINE:  No gross deformities, no edema SKIN: Edema, induration, redness to the right calf, neurovascularly intact distally PSYCH:  Appropriate speech and behavior  Diagnostic and Interventional Summary     EKG Interpretation  Date/Time:    Ventricular Rate:    PR Interval:    QRS Duration:   QT Interval:    QTC Calculation:   R Axis:     Text Interpretation:        Labs Reviewed  BASIC METABOLIC PANEL - Abnormal; Notable for the following components:      Result Value   Potassium 2.7 (*)    Chloride 96 (*)    CO2 33 (*)    BUN 21 (*)    All other components within normal limits  CBC WITH DIFFERENTIAL/PLATELET - Abnormal; Notable for the following components:   Hemoglobin 15.1 (*)    Abs Immature Granulocytes 0.13 (*)    All other components within normal limits  CULTURE, BLOOD (SINGLE)  SARS CORONAVIRUS 2 (TAT 6-24 HRS)  LACTIC ACID, PLASMA  LACTIC ACID, PLASMA  MAGNESIUM  TSH  GLUCOSE, CAPILLARY  HIV ANTIBODY (ROUTINE TESTING W REFLEX)  HEMOGLOBIN A1C  COMPREHENSIVE METABOLIC PANEL  CBC  POTASSIUM    CT TIBIA FIBULA RIGHT W CONTRAST  Final Result      Medications  potassium chloride 10 mEq in 100 mL IVPB ( Intravenous Rate/Dose Change 06/30/19 2031)  vancomycin (VANCOREADY) IVPB 1250 mg/250 mL (has no administration in time range)  lidocaine (PF) (XYLOCAINE) 1 % injection (has no administration in time range)  lidocaine-EPINEPHrine (XYLOCAINE-EPINEPHrine) 1 %-1:200000 (PF) injection 20 mL (has no administration in time range)  promethazine (PHENERGAN) tablet 12.5 mg (has no administration in time range)  atorvastatin (LIPITOR) tablet 80 mg (has no administration in time range)  lisinopril (ZESTRIL) tablet 20 mg (has no administration in time range)  levothyroxine (SYNTHROID) tablet 100 mcg (has no administration in time range)  pantoprazole (PROTONIX) EC tablet 40 mg (has no administration in time range)  acetaminophen (TYLENOL) tablet 650 mg (has no administration in time range)    Or  acetaminophen (TYLENOL) suppository 650 mg (has no administration in time range)  ibuprofen (ADVIL) tablet 600 mg (has no administration in time range)  polyethylene glycol  (MIRALAX / GLYCOLAX) packet 17 g (has no administration in time range)  ondansetron (ZOFRAN) tablet 4 mg (has no administration in time range)    Or  ondansetron (ZOFRAN) injection 4 mg (has no administration in time range)  insulin aspart (novoLOG) injection 0-20 Units (has no administration in time range)  potassium chloride 10 mEq in 100 mL IVPB (has no administration in time range)  potassium chloride (KLOR-CON) packet 20 mEq (20 mEq Oral Given 06/30/19 2307)  povidone-Iodine (BETADINE) 5 % topical solution (has no administration in time range)  povidone-iodine (BETADINE) 10 % external solution (has no administration in time range)  enoxaparin (LOVENOX) injection 70 mg (70 mg Subcutaneous Given 06/30/19 2308)  sodium chloride 0.9 % bolus 1,000 mL (0 mLs Intravenous Stopped 06/30/19 2025)  potassium chloride SA (KLOR-CON) CR tablet 40 mEq (40 mEq Oral Given 06/30/19 1737)  magnesium oxide (MAG-OX) tablet 800 mg (800 mg Oral Given 06/30/19 1736)  vancomycin (VANCOREADY) IVPB 2000 mg/400 mL (2,000 mg Intravenous New Bag/Given 06/30/19 2029)  iohexol (OMNIPAQUE) 300 MG/ML solution 75 mL (75 mLs Intravenous Contrast Given 06/30/19 1730)     Procedures  /  Critical Care Procedures  ED Course and Medical Decision Making  I have reviewed the triage vital signs and the nursing notes.  Pertinent labs & imaging results that were available during my care of the patient were reviewed by me and considered in my medical decision making (see below for details).     Hypokalemia, fluid collection of the leg, concern for possible underlying abscess, will evaluate further with CT imaging of the leg, will replete potassium, will admit to the hospital.  Discussed CT imaging and concern for abscess with orthopedics on-call, who recommend bedside I&D as abscess is superficial and does not disrupt the fascia.  See separate procedure note for details.  Admitted to hospital service for further antibiotics and  potassium repletion.  Elmer Sow. Pilar Plate, MD [facility] Emergency Medicine [facility] [email or web address]  Final Clinical Impressions(s) / ED Diagnoses     ICD-10-CM   1. Hypokalemia  E87.6   2. Cellulitis and abscess of leg  L03.119    L02.419     ED Discharge Orders    None       Discharge Instructions Discussed with and Provided to Patient:   Discharge Instructions   None       Sabas Sous, MD 07/01/19 0003

## 2019-06-30 NOTE — ED Triage Notes (Signed)
Patient brought from [place] for ED eval. Patient has edema, redness, and pain in her R lower extremity. Onset 1 week ago.

## 2019-06-30 NOTE — ED Notes (Signed)
Report given to Jenny RN.

## 2019-06-30 NOTE — ED Provider Notes (Signed)
Patient seen in VT bed after DVT study was obtained.  This was ordered on 06/23/2019 when she was seen by Dr. Laverta Baltimore for lower extremity edema and erythema to rule out DVT.   Duplex is negative for DVT but does show 11.5x1.8x4.9cm fluid collection with the subcutaneous tissues about the lateral aspect of the right calf.  On my assessment patient tells me that she is almost done with her course of doxycycline that was prescribed to her when she was seen 7 days ago with no improvement in swelling, erythema, and pain.  She reports in fact the swelling has worsened.  She initially did have some chills and nausea when symptoms began but reports that this has improved.  I am concerned that she has an abscess in her right lower extremity as well as persistent cellulitis despite antibiotic treatment.  I encouraged her to check back in to the ED for further assessment and management, possible I&D, IV antibiotics, and potential admission to the hospital.  She verbalized understanding and is agreeable to this plan.   Renita Papa, PA-C 06/30/19 1313    Elnora Morrison, MD 06/30/19 1538

## 2019-06-30 NOTE — H&P (Addendum)
TRH H&P    Patient Demographics:    Jaclyn Klein, is a 53 y.o. female  MRN: [id number]  DOB - 05/15/1966  Admit Date - 06/30/2019  Referring MD/NP/PA: Dr. Pilar Plate  Outpatient Primary MD for the patient is Eartha Inch, MD  Patient coming from: Home  Chief complaint- Leg swelling    HPI:    Jaclyn Klein  is a 53 y.o. female, with history of hypothyroidism, HTN, HLD and more presents to the ER today for a chief complaint of leg pain. Patient was here a week ago, and was diagnosed with likely cellulitis. She was sent home with a script for Doxycycline and advised to come back the next day for an [place] to r/o DVT. Patient came back today, which is 7 days later. She has had leg swelling, erythema and irritation for a total of 9 days now. Patient reports that it started with erythema, progressed to swelling, and then became very tender to palpation. She has tried to use ibuprofen, but can't tell if it has helped with the pain or not. Patient reports that when the leg is at rest with no palpation the pain is 0/10. At its worst it is 6-7/10. She has not noticed any drainage from the area. She has associated subjective fever and chills, as well as decreased appetite, and "some" shortness of breath. She reports that she also had loose stools one time daily for 3 days last week. The loose stools were likely due to the doxycycline. Patient has felt fatigued, and does report a cough. The cough is dry and has been present for several months. Patient is on lisinopril, but reports that the cough is not associated with the lisinopril.  Patient drinks a "few" glasses of wine daily. She reports that her last drink was 12 days ago, because she did not want to drink and take antibiotic at the same time.    Review of systems:    In addition to the HPI above,   No Headache, No changes with Vision or hearing, No problems swallowing  food or Liquids, No Chest pain, chronic Cough, mild Shortness of Breath, No Abdominal pain, No Nausea or Vomiting, bowel movements are regular, No Blood in stool or Urine, No dysuria, Skin - erythema  No new joints pains-aches,  No new weakness, tingling, numbness in any extremity, No recent weight gain or loss, No polyuria, polydypsia or polyphagia, No significant Mental Stressors.  All other systems reviewed and are negative.    Past History of the following :    Past Medical History:  Diagnosis Date  . Hyperlipidemia   . Hypertension       Past Surgical History:  Procedure Laterality Date  . KNEE ARTHROSCOPY WITH MEDIAL MENISECTOMY Right 08/22/2015   Procedure: KNEE ARTHROSCOPY WITH MEDIAL MENISECTOMY, PATELLAR AND FEMUR CHONDROPLASTY;  Surgeon: Vickki Hearing, MD;  Location: [facility];  Service: Orthopedics;  Laterality: Right;  . NO PAST SURGERIES        Social History:      Social History  Tobacco Use  . Smoking status: Never Smoker  . Smokeless tobacco: Never Used  Substance Use Topics  . Alcohol use: Yes    Alcohol/week: 14.0 standard drinks    Types: 14 Glasses of wine per week    Comment: 2 glasses to bottle a day       Family History :     Family History  Problem Relation Age of Onset  . Healthy Mother   . Healthy Father       Home Medications:   Prior to Admission medications   Medication Sig Start Date End Date Taking? Authorizing Provider  atorvastatin (LIPITOR) 80 MG tablet Take 1 tablet by mouth daily. 03/14/19  Yes [provider]  doxycycline (VIBRAMYCIN) 100 MG capsule Take 1 capsule (100 mg total) by mouth 2 (two) times daily for 7 days. 06/23/19 06/30/19 Yes Long, Arlyss Repress, MD  esomeprazole (NEXIUM) 40 MG capsule Take 40 mg by mouth daily at 12 noon.   Yes [provider]  levothyroxine (SYNTHROID) 100 MCG tablet Take 1 tablet by mouth daily. 03/03/19  Yes [provider]  lisinopril-hydrochlorothiazide  (PRINZIDE,ZESTORETIC) 20-12.5 MG per tablet Take 2 tablets by mouth daily.   Yes [provider]  lovastatin (MEVACOR) 20 MG tablet Take 20 mg by mouth at bedtime.   Yes [provider]  naproxen sodium (ANAPROX) 220 MG tablet Take 220 mg by mouth 2 (two) times daily with a meal.   Yes [provider]  ibuprofen (ADVIL) 800 MG tablet Take 800 mg by mouth 2 (two) times daily.    [provider]  Norethindrone (LYZA PO) Take by mouth.    [provider]  promethazine (PHENERGAN) 12.5 MG tablet Take 1 tablet (12.5 mg total) by mouth every 6 (six) hours as needed for nausea or vomiting. 08/22/15   Vickki Hearing, MD     Allergies:    No Known Allergies   Physical Exam:   Vitals  Blood pressure (!) 155/114, pulse 73, temperature 98.2 F (36.8 C), temperature source Oral, resp. rate 18, height  (1.626 m), weight 136.1 kg, SpO2 96 %.  1.  General: Laying supine in bed in no acute distress  2. Psychiatric: Tearful and anxious about being in the hospital without her mom  3. Neurologic: CN II-XII grossly intact, no focal deficits on limited exam.  4. HEENMT:  Head is atraumatic normocephalic, pupils are reactive to light. EOMI. Trache midline. Neck with excess adipose tissue.   5. Respiratory : LCTABL  6. Cardiovascular : HRRR  7. Gastrointestinal:  Abdomen is soft, non distended, and non tender to palpation  8. Skin:  Right leg is erythematous with fluid collection on medial calf, no drainage. Scabbing on anterior shin.   9.Musculoskeletal:  2+ pitting edema in the lower extremities BL.     Data Review:    CBC Recent Labs  Lab 06/30/19 1343  WBC 10.0  HGB 15.1*  HCT 45.7  PLT 179  MCV 97.9  MCH 32.3  MCHC 33.0  RDW 14.2  LYMPHSABS 1.6  MONOABS 0.7  EOSABS 0.1  BASOSABS 0.1   ------------------------------------------------------------------------------------------------------------------  Results for  orders placed or performed during the hospital encounter of 06/30/19 (from the past 48 hour(s))  Basic metabolic panel     Status: Abnormal   Collection Time: 06/30/19  1:43 PM  Result Value Ref Range   Sodium 140 135 - 145 mmol/L   Potassium 2.7 (LL) 3.5 - 5.1 mmol/L  Comment: CRITICAL RESULT CALLED TO, READ BACK BY AND VERIFIED WITH: GENTRY,R ON 06/30/19 AT 1525 BY LOY,C    Chloride 96 (L) 98 - 111 mmol/L   CO2 33 (H) 22 - 32 mmol/L   Glucose, Bld 93 70 - 99 mg/dL   BUN 21 (H) 6 - 20 mg/dL   Creatinine, Ser 8.31 0.44 - 1.00 mg/dL   Calcium 8.9 8.9 - 51.7 mg/dL   GFR calc non Af Amer >60 >60 mL/min   GFR calc Af Amer >60 >60 mL/min   Anion gap 11 5 - 15    Comment: Performed at [facility], [address] [place], [place] [address]  CBC with Differential     Status: Abnormal   Collection Time: 06/30/19  1:43 PM  Result Value Ref Range   WBC 10.0 4.0 - 10.5 K/uL   RBC 4.67 3.87 - 5.11 MIL/uL   Hemoglobin 15.1 (H) 12.0 - 15.0 g/dL   HCT 37.1 06.2 - 69.4 %   MCV 97.9 80.0 - 100.0 fL   MCH 32.3 26.0 - 34.0 pg   MCHC 33.0 30.0 - 36.0 g/dL   RDW 85.4 62.7 - 03.5 %   Platelets 179 150 - 400 K/uL   nRBC 0.0 0.0 - 0.2 %   Neutrophils Relative % 74 %   Neutro Abs 7.5 1.7 - 7.7 K/uL   Lymphocytes Relative 16 %   Lymphs Abs 1.6 0.7 - 4.0 K/uL   Monocytes Relative 7 %   Monocytes Absolute 0.7 0.1 - 1.0 K/uL   Eosinophils Relative 1 %   Eosinophils Absolute 0.1 0.0 - 0.5 K/uL   Basophils Relative 1 %   Basophils Absolute 0.1 0.0 - 0.1 K/uL   Immature Granulocytes 1 %   Abs Immature Granulocytes 0.13 (H) 0.00 - 0.07 K/uL    Comment: Performed at [facility], [address], [place], [place] [address]  Lactic acid, plasma     Status: None   Collection Time: 06/30/19  1:43 PM  Result Value Ref Range   Lactic Acid, Venous 0.9 0.5 - 1.9 mmol/L    Comment: Performed at [facility], [address], [place], [place] [address]  Lactic acid, plasma     Status: None   Collection Time:  06/30/19  3:35 PM  Result Value Ref Range   Lactic Acid, Venous 1.2 0.5 - 1.9 mmol/L    Comment: Performed at [facility], [address], [place], [place] [address]  Blood Culture x 1     Status: None (Preliminary result)   Collection Time: 06/30/19  4:29 PM   Specimen: Left Antecubital; Blood  Result Value Ref Range   Specimen Description LEFT ANTECUBITAL    Special Requests      BOTTLES DRAWN AEROBIC AND ANAEROBIC Blood Culture adequate volume Performed at [facility], [address] [place], [place] [address]    Culture PENDING    Report Status PENDING     Chemistries  Recent Labs  Lab 06/30/19 1343  NA 140  K 2.7*  CL 96*  CO2 33*  GLUCOSE 93  BUN 21*  CREATININE 0.79  CALCIUM 8.9   ------------------------------------------------------------------------------------------------------------------  ------------------------------------------------------------------------------------------------------------------ GFR: Estimated Creatinine Clearance: 112.1 mL/min (by C-G formula based on SCr of 0.79 mg/dL). Liver Function Tests: No results for input(s): AST, ALT, ALKPHOS, BILITOT, PROT, ALBUMIN in the last 168 hours. No results for input(s): LIPASE, AMYLASE in the last 168 hours. No results for input(s): AMMONIA in the last 168 hours. Coagulation Profile: No  results for input(s): INR, PROTIME in the last 168 hours. Cardiac Enzymes: No results for input(s): CKTOTAL, CKMB, CKMBINDEX, TROPONINI in the last 168 hours. BNP (last 3 results) No results for input(s): PROBNP in the last 8760 hours. HbA1C: No results for input(s): HGBA1C in the last 72 hours. CBG: No results for input(s): GLUCAP in the last 168 hours. Lipid Profile: No results for input(s): CHOL, HDL, LDLCALC, TRIG, CHOLHDL, LDLDIRECT in the last 72 hours. Thyroid Function Tests: No results for input(s): TSH, T4TOTAL, FREET4, T3FREE, THYROIDAB in the last 72 hours. Anemia Panel: No results for input(s):  VITAMINB12, FOLATE, FERRITIN, TIBC, IRON, RETICCTPCT in the last 72 hours.  --------------------------------------------------------------------------------------------------------------- Urine analysis: No results found for: COLORURINE, APPEARANCEUR, LABSPEC, PHURINE, GLUCOSEU, HGBUR, BILIRUBINUR, KETONESUR, PROTEINUR, UROBILINOGEN, NITRITE, LEUKOCYTESUR    Imaging Results:    CT TIBIA FIBULA RIGHT W CONTRAST  Result Date: 06/30/2019 CLINICAL DATA:  Right lower extremity edema, erythema and pain for 1 week. Evaluate for abscess or deep space infection. EXAM: CT OF THE LOWER RIGHT EXTREMITY WITH CONTRAST TECHNIQUE: Multidetector CT imaging of the right lower leg was performed according to the standard protocol following intravenous contrast administration. COMPARISON:  Doppler ultrasound same date. CONTRAST:  75mL OMNIPAQUE IOHEXOL 300 MG/ML  SOLN FINDINGS: Bones/Joint/Cartilage No evidence of acute fracture, dislocation or bone destruction. There are tricompartmental degenerative changes at the knee, most advanced in the medial compartment. There are probable small intra-articular loose bodies. No significant knee joint effusion. Mild degenerative changes are present at the ankle with subchondral cyst formation medially in the talar dome. Ligaments Suboptimally assessed by CT. No gross abnormalities. Muscles and Tendons Unremarkable. The extensor mechanism appears intact at the knee. The Achilles tendon is intact. Soft tissues There is a peripherally enhancing lenticular shaped fluid collection laterally in the proximal aspect of the lower leg. This measures up to 11.1 cm in length and up to 4.3 x 2.3 cm transverse. This collection lies superficial to the anterior and lateral compartment musculature and is located within the subcutaneous fat. There is diffuse edema throughout the subcutaneous fat with mild dermal thickening. No foreign body or soft tissue emphysema. No other focal fluid collections are  seen. IMPRESSION: 1. Peripherally enhancing lenticular shaped fluid collection laterally in the proximal aspect of the lower leg, consistent with an abscess in the appropriate clinical context. This could reflect a subacute hematoma or complex seroma. 2. No evidence of osteomyelitis or septic joint. 3. Tricompartmental degenerative changes at the knee, most advanced in the medial compartment, with probable small intra-articular loose bodies. Subchondral cyst formation medially in the talar dome. Electronically Signed   By: Carey BullocksWilliam  Veazey M.D.   On: 06/30/2019 17:59   US Venous Img Lower Unilateral Right  Result Date: 06/30/2019 CLINICAL DATA:  Right lower extremity pain and edema for the past 3 weeks. Evaluate for DVT. EXAM: RIGHT LOWER EXTREMITY VENOUS DOPPLER ULTRASOUND TECHNIQUE: Gray-scale sonography with graded compression, as well as color Doppler and duplex ultrasound were performed to evaluate the lower extremity deep venous systems from the level of the common femoral vein and including the common femoral, femoral, profunda femoral, popliteal and calf veins including the posterior tibial, peroneal and gastrocnemius veins when visible. The superficial great saphenous vein was also interrogated. Spectral Doppler was utilized to evaluate flow at rest and with distal augmentation maneuvers in the common femoral, femoral and popliteal veins. COMPARISON:  None. FINDINGS: Contralateral Common Femoral Vein: Respiratory phasicity is normal and symmetric with the symptomatic side. No evidence of  thrombus. Normal compressibility. Common Femoral Vein: No evidence of thrombus. Normal compressibility, respiratory phasicity and response to augmentation. Saphenofemoral Junction: No evidence of thrombus. Normal compressibility and flow on color Doppler imaging. Profunda Femoral Vein: No evidence of thrombus. Normal compressibility and flow on color Doppler imaging. Femoral Vein: No evidence of thrombus. Normal  compressibility, respiratory phasicity and response to augmentation. Popliteal Vein: No evidence of thrombus. Normal compressibility, respiratory phasicity and response to augmentation. Calf Veins: No evidence of thrombus. Normal compressibility and flow on color Doppler imaging. Superficial Great Saphenous Vein: No evidence of thrombus. Normal compressibility. Venous Reflux:  None. Other Findings: Note is made of an approximately 11.5 x 1.8 x 4.9 cm fluid collection with the subcutaneous tissues about lateral aspect of the right. IMPRESSION: 1. No evidence of DVT within the right lower extremity. 2. Incidental note made of an approximately 11.5 cm fluid collection within the subcutaneous tissues about the lateral aspect of the right calf, nonspecific with differential considerations including hematoma, seroma and abscess. Clinical correlation is advised. Electronically Signed   By: Sandi Mariscal M.D.   On: 06/30/2019 12:22       Assessment & Plan:    Active Problems:   Cellulitis and abscess of leg   1. Purulent Cellulitis 1. Failed doxycycline 2. ED provider planning for I and D 3. Vancomycin started - continue with pharmacy recs 2. Hypokalemia 1. K+ 2.7 2. Add magnesium on previous blood sample - replace as indicated 3. Hold HCTZ in the setting of hypokalemia 4. Continue to trend on am CMP 3. Hypothyroid 1. Check TSH 2. Continue levothyroxine 4. HLD 1. Continue statin medication 5. HTN 1. Continue home BP medications 2. Hold HCTZ in the setting of hypokalemia - may need to increase lisinopril to 40mg  if BP is not controlled without the HCTZ combination 6.    DVT Prophylaxis-   Lovenox - SCD   AM Labs Ordered, also please review Full Orders  Family Communication: No family member at bedside  Code Status:  Full  Admission status: Observation: Based on patients clinical presentation and evaluation of above clinical data, I have made determination that patient meets observation  criteria at this time.  Time spent in minutes : [place].D

## 2019-06-30 NOTE — ED Provider Notes (Signed)
..  Incision and Drainage  Date/Time: 06/30/2019 9:33 PM Performed by: Renita Papa, PA-C Authorized by: Renita Papa, PA-C   Consent:    Consent obtained:  Verbal   Consent given by:  Patient   Risks discussed:  Bleeding, incomplete drainage, pain and damage to other organs   Alternatives discussed:  No treatment Universal protocol:    Procedure explained and questions answered to patient or proxy's satisfaction: yes     Relevant documents present and verified: yes     Test results available and properly labeled: yes     Imaging studies available: yes     Required blood products, implants, devices, and special equipment available: yes     Site/side marked: yes     Immediately prior to procedure a time out was called: yes     Patient identity confirmed:  Verbally with patient Location:    Type:  Abscess   Size:  11x2cm   Location:  Lower extremity   Lower extremity location:  Leg   Leg location:  R lower leg Pre-procedure details:    Skin preparation:  Betadine Anesthesia (see MAR for exact dosages):    Anesthesia method:  Local infiltration   Local anesthetic:  Lidocaine 1% WITH epi Procedure type:    Complexity:  Complex Procedure details:    Incision types:  Single straight   Incision depth:  Subcutaneous   Scalpel blade:  11   Wound management:  Probed and deloculated, irrigated with saline and extensive cleaning   Drainage:  Purulent and bloody   Drainage amount:  Copious   Wound treatment:  Drain placed   Packing materials:  1/4 in gauze Post-procedure details:    Patient tolerance of procedure:  Tolerated well, no immediate complications      Jaclyn Klein 06/30/19 2134    Jaclyn Flakes, MD 07/04/19 [id number]

## 2019-06-30 NOTE — Progress Notes (Signed)
Pharmacy Antibiotic Note  Jaclyn Klein is a 53 y.o. female admitted on 06/30/2019 with cellulitis.  Pharmacy has been consulted for vancomycin dosing.  Plan: Vancomycin 1250mg  IV every 12 hours.  Goal trough 10-15 mcg/mL.  Height: 5\' 4"  (162.6 cm) Weight: 300 lb (136.1 kg) IBW/kg (Calculated) : 54.7  Temp (24hrs), Avg:98.2 F (36.8 C), Min:98.2 F (36.8 C), Max:98.2 F (36.8 C)  Recent Labs  Lab 06/23/19 1952 06/30/19 1343  WBC 11.9* 10.0  CREATININE 0.82 0.79  LATICACIDVEN  --  0.9    Estimated Creatinine Clearance: 112.1 mL/min (by C-G formula based on SCr of 0.79 mg/dL).    No Known Allergies  Antimicrobials this admission: 12/18 vancomycin >>   Microbiology results: 12/18 BCx: sent  12/18 Covid 19:sent   Thank you for allowing pharmacy to be a part of this patient's care.  Donna Christen Jonna Dittrich 06/30/2019 4:04 PM

## 2019-07-01 DIAGNOSIS — M79604 Pain in right leg: Secondary | ICD-10-CM

## 2019-07-01 DIAGNOSIS — M79606 Pain in leg, unspecified: Secondary | ICD-10-CM | POA: Diagnosis present

## 2019-07-01 LAB — COMPREHENSIVE METABOLIC PANEL
ALT: 25 U/L (ref 0–44)
AST: 35 U/L (ref 15–41)
Albumin: 2.6 g/dL — ABNORMAL LOW (ref 3.5–5.0)
Alkaline Phosphatase: 110 U/L (ref 38–126)
Anion gap: 10 (ref 5–15)
BUN: 17 mg/dL (ref 6–20)
CO2: 28 mmol/L (ref 22–32)
Calcium: 8.5 mg/dL — ABNORMAL LOW (ref 8.9–10.3)
Chloride: 100 mmol/L (ref 98–111)
Creatinine, Ser: 0.7 mg/dL (ref 0.44–1.00)
GFR calc Af Amer: >60 mL/min (ref >60)
GFR calc non Af Amer: >60 mL/min (ref >60)
Glucose, Bld: 93 mg/dL (ref 70–99)
Potassium: 3.1 mmol/L — ABNORMAL LOW (ref 3.5–5.1)
Sodium: 138 mmol/L (ref 135–145)
Total Bilirubin: 1.4 mg/dL — ABNORMAL HIGH (ref 0.3–1.2)
Total Protein: 6.3 g/dL — ABNORMAL LOW (ref 6.5–8.1)

## 2019-07-01 LAB — CBC
HCT: 42 % (ref 36.0–46.0)
Hemoglobin: 13.9 g/dL (ref 12.0–15.0)
MCH: 32.8 pg (ref 26.0–34.0)
MCHC: 33.1 g/dL (ref 30.0–36.0)
MCV: 99.1 fL (ref 80.0–100.0)
Platelets: 171 10*3/uL (ref 150–400)
RBC: 4.24 MIL/uL (ref 3.87–5.11)
RDW: 14.3 % (ref 11.5–15.5)
WBC: 9.9 10*3/uL (ref 4.0–10.5)
nRBC: 0 % (ref 0.0–0.2)

## 2019-07-01 LAB — SARS CORONAVIRUS 2 (TAT 6-24 HRS): SARS Coronavirus 2: NEGATIVE

## 2019-07-01 LAB — HEMOGLOBIN A1C
Hgb A1c MFr Bld: 6.4 % — ABNORMAL HIGH (ref 4.8–5.6)
Mean Plasma Glucose: 136.98 mg/dL

## 2019-07-01 LAB — HIV ANTIBODY (ROUTINE TESTING W REFLEX): HIV Screen 4th Generation wRfx: NONREACTIVE

## 2019-07-01 LAB — GLUCOSE, CAPILLARY
Glucose-Capillary: 101 mg/dL — ABNORMAL HIGH (ref 70–99)
Glucose-Capillary: 113 mg/dL — ABNORMAL HIGH (ref 70–99)
Glucose-Capillary: 93 mg/dL (ref 70–99)

## 2019-07-01 LAB — POTASSIUM: Potassium: 2.8 mmol/L — ABNORMAL LOW (ref 3.5–5.1)

## 2019-07-01 MED ORDER — MELATONIN 3 MG PO TABS
6.0000 mg | ORAL_TABLET | Freq: Every evening | ORAL | Status: DC | PRN
  Administered 2019-07-01 – 2019-07-02 (×3): 6 mg via ORAL
  Filled 2019-07-01 (×3): qty 2

## 2019-07-01 MED ORDER — LORAZEPAM 2 MG/ML IJ SOLN: 1.0000 mg | Freq: Four times a day (QID) | INTRAMUSCULAR | Status: DC | PRN

## 2019-07-01 MED ORDER — VANCOMYCIN HCL 1500 MG/300ML IV SOLN
1500.0000 mg | Freq: Two times a day (BID) | INTRAVENOUS | Status: DC
  Administered 2019-07-01 – 2019-07-03 (×5): 1500 mg via INTRAVENOUS
  Filled 2019-07-01 (×5): qty 300

## 2019-07-01 MED ORDER — POTASSIUM CHLORIDE CRYS ER 20 MEQ PO TBCR
40.0000 meq | EXTENDED_RELEASE_TABLET | ORAL | Status: AC
  Administered 2019-07-01 (×2): 40 meq via ORAL
  Filled 2019-07-01 (×2): qty 2

## 2019-07-01 NOTE — Progress Notes (Signed)
Patient Demographics:    Jaclyn Klein, is a 53 y.o. female, DOB - [birth year], OIN:[id number]  Admit date - 06/30/2019   Admitting Physician Rolla Plate, DO  Outpatient Primary MD for the patient is Chesley Noon, MD  LOS - 0   Chief Complaint  Patient presents with  . Cellulitis        Subjective:    Jaclyn Klein today has no fevers, no emesis,  No chest pain,   --Right lower extremity swelling and pain persist  Assessment  & Plan :    Active Problems:   Cellulitis and abscess of leg   Leg pain  Brief Summary:- 53 y.o. female, with history of hypothyroidism, HTN, HLD and morbid obesity admitted on 06/30/2019 with worsening right lower extremity cellulitis after failing outpatient doxycycline  A/p 1)Right lower extremity cellulitis and Abscess--- please see CT tibia-fibula and ultrasound of right lower extremity report dated 06/30/2019 -Failed outpatient doxycycline, continue IV vancomycin pending wound and blood culture results -No evidence of DVT  2)Morbid obesity--BMI over 50, this complicates overall care  3)HLD--- we will, continue Lipitor  4)HTN--- stable, continue lisinopril 20 mg daily  5)Hypothyroidism--stable, continue levothyroxine  Disposition/Need for in-Hospital Stay- patient unable to be discharged at this time due to right lower extremity abscess requiring IV antibiotics after failing outpatient oral antibiotics*  Code Status : full   Family Communication:   NA (patient is alert, awake and coherent)   Disposition Plan  : home  Consults  : na  DVT Prophylaxis  :  Lovenox  - SCD  Lab Results  Component Value Date   PLT 171 07/01/2019    Inpatient Medications  Scheduled Meds: . atorvastatin  80 mg Oral Daily  . enoxaparin (LOVENOX) injection  70 mg Subcutaneous Q24H  . insulin aspart  0-20 Units Subcutaneous TID WC  . levothyroxine  100 mcg  Oral Daily  . lidocaine-EPINEPHrine  20 mL Intradermal Once  . lisinopril  20 mg Oral Daily  . pantoprazole  40 mg Oral Daily  . potassium chloride  20 mEq Oral Daily  . povidone-Iodine   Topical Once   Continuous Infusions: . vancomycin     PRN Meds:.acetaminophen **OR** acetaminophen, ibuprofen, LORazepam, Melatonin, ondansetron **OR** ondansetron (ZOFRAN) IV, polyethylene glycol, promethazine    Anti-infectives (From admission, onward)   Start     Dose/Rate Route Frequency Ordered Stop   07/01/19 1700  vancomycin (VANCOREADY) IVPB 1500 mg/300 mL     1,500 mg 150 mL/hr over 120 Minutes Intravenous Every 12 hours 07/01/19 1332     07/01/19 0400  vancomycin (VANCOREADY) IVPB 1250 mg/250 mL  Status:  Discontinued     1,250 mg 166.7 mL/hr over 90 Minutes Intravenous Every 12 hours 06/30/19 1604 07/01/19 1332   06/30/19 1615  vancomycin (VANCOREADY) IVPB 2000 mg/400 mL     2,000 mg 200 mL/hr over 120 Minutes Intravenous  Once 06/30/19 1603 06/30/19 2229        Objective:   Vitals:   06/30/19 1738 06/30/19 2204 07/01/19 0511 07/01/19 1309  BP: (!) 155/114 (!) 142/105 125/77 122/84  Pulse: 73 77 83 80  Resp: 18 18 17 17   Temp:  97.8 F (36.6 C) 98.1 F (36.7 C) 98.9 F (37.2  C)  TempSrc:  Oral  Oral  SpO2: 96% 95% 93% 97%  Weight:  (!) 144.7 kg    Height:   (1.626 m)      Wt Readings from Last 3 Encounters:  06/30/19 (!) 144.7 kg  06/23/19 136.1 kg  08/26/15 125.6 kg     Intake/Output Summary (Last 24 hours) at 07/01/2019 1734 Last data filed at 07/01/2019 1300 Gross per 24 hour  Intake 964.14 ml  Output --  Net 964.14 ml   Physical Exam  Gen:- Awake Alert, morbidly obese HEENT:- [place].AT, No sclera icterus Neck-Supple Neck,No JVD,.  Lungs-  CTAB , fair symmetrical air movement CV- S1, S2 normal, regular  Abd-  +ve B.Sounds, Abd Soft, No tenderness, increased truncal adiposity Psych-affect is appropriate, oriented x3 Neuro-no new focal deficits, no  tremors Rt Leg-please see photos in epic below  Media Information   Document Information  Photos    07/01/2019 10:10  Attached To:  Hospital Encounter on 06/30/19  Source Information  Jaclyn Hale, MD  [facility]     Data Review:   Micro Results Recent Results (from the past 240 hour(s))  Blood Culture x 1     Status: None (Preliminary result)   Collection Time: 06/30/19  4:29 PM   Specimen: Left Antecubital; Blood  Result Value Ref Range Status   Specimen Description LEFT ANTECUBITAL  Final   Special Requests   Final    BOTTLES DRAWN AEROBIC AND ANAEROBIC Blood Culture adequate volume   Culture   Final    NO GROWTH < 24 HOURS Performed at [facility], [address] [place], [place] [address]    Report Status PENDING  Incomplete  SARS CORONAVIRUS 2 (TAT 6-24 HRS) Nasopharyngeal Nasopharyngeal Swab     Status: None   Collection Time: 06/30/19  4:38 PM   Specimen: Nasopharyngeal Swab  Result Value Ref Range Status   SARS Coronavirus 2 NEGATIVE NEGATIVE Final    Comment: (NOTE) SARS-CoV-2 target nucleic acids are NOT DETECTED. The SARS-CoV-2 RNA is generally detectable in upper and lower respiratory specimens during the acute phase of infection. Negative results do not preclude SARS-CoV-2 infection, do not rule out co-infections with other pathogens, and should not be used as the sole basis for treatment or other patient management decisions. Negative results must be combined with clinical observations, patient history, and epidemiological information. The expected result is Negative. Fact Sheet for Patients: HairSlick.no Fact Sheet for Healthcare Providers: quierodirigir.com This test is not yet approved or cleared by the [place] FDA and  has been authorized for detection and/or diagnosis of SARS-CoV-2 by FDA under an Emergency Use Authorization (EUA). This EUA will remain  in effect (meaning this  test can be used) for the duration of the COVID-19 declaration under Section 56 4(b)(1) of the Act, 21 U.S.C. section 360bbb-3(b)(1), unless the authorization is terminated or revoked sooner. Performed at [facility], [address], [place], [place] [address]     Radiology Reports CT TIBIA FIBULA RIGHT W CONTRAST  Result Date: 06/30/2019 CLINICAL DATA:  Right lower extremity edema, erythema and pain for 1 week. Evaluate for abscess or deep space infection. EXAM: CT OF THE LOWER RIGHT EXTREMITY WITH CONTRAST TECHNIQUE: Multidetector CT imaging of the right lower leg was performed according to the standard protocol following intravenous contrast administration. COMPARISON:  Doppler ultrasound same date. CONTRAST:  75mL OMNIPAQUE IOHEXOL 300 MG/ML  SOLN FINDINGS: Bones/Joint/Cartilage No evidence of acute fracture, dislocation or bone destruction. There  are tricompartmental degenerative changes at the knee, most advanced in the medial compartment. There are probable small intra-articular loose bodies. No significant knee joint effusion. Mild degenerative changes are present at the ankle with subchondral cyst formation medially in the talar dome. Ligaments Suboptimally assessed by CT. No gross abnormalities. Muscles and Tendons Unremarkable. The extensor mechanism appears intact at the knee. The Achilles tendon is intact. Soft tissues There is a peripherally enhancing lenticular shaped fluid collection laterally in the proximal aspect of the lower leg. This measures up to 11.1 cm in length and up to 4.3 x 2.3 cm transverse. This collection lies superficial to the anterior and lateral compartment musculature and is located within the subcutaneous fat. There is diffuse edema throughout the subcutaneous fat with mild dermal thickening. No foreign body or soft tissue emphysema. No other focal fluid collections are seen. IMPRESSION: 1. Peripherally enhancing lenticular shaped fluid collection laterally  in the proximal aspect of the lower leg, consistent with an abscess in the appropriate clinical context. This could reflect a subacute hematoma or complex seroma. 2. No evidence of osteomyelitis or septic joint. 3. Tricompartmental degenerative changes at the knee, most advanced in the medial compartment, with probable small intra-articular loose bodies. Subchondral cyst formation medially in the talar dome. Electronically Signed   By: Carey Bullocks M.D.   On: 06/30/2019 17:59   US Venous Img Lower Unilateral Right  Result Date: 06/30/2019 CLINICAL DATA:  Right lower extremity pain and edema for the past 3 weeks. Evaluate for DVT. EXAM: RIGHT LOWER EXTREMITY VENOUS DOPPLER ULTRASOUND TECHNIQUE: Gray-scale sonography with graded compression, as well as color Doppler and duplex ultrasound were performed to evaluate the lower extremity deep venous systems from the level of the common femoral vein and including the common femoral, femoral, profunda femoral, popliteal and calf veins including the posterior tibial, peroneal and gastrocnemius veins when visible. The superficial great saphenous vein was also interrogated. Spectral Doppler was utilized to evaluate flow at rest and with distal augmentation maneuvers in the common femoral, femoral and popliteal veins. COMPARISON:  None. FINDINGS: Contralateral Common Femoral Vein: Respiratory phasicity is normal and symmetric with the symptomatic side. No evidence of thrombus. Normal compressibility. Common Femoral Vein: No evidence of thrombus. Normal compressibility, respiratory phasicity and response to augmentation. Saphenofemoral Junction: No evidence of thrombus. Normal compressibility and flow on color Doppler imaging. Profunda Femoral Vein: No evidence of thrombus. Normal compressibility and flow on color Doppler imaging. Femoral Vein: No evidence of thrombus. Normal compressibility, respiratory phasicity and response to augmentation. Popliteal Vein: No evidence  of thrombus. Normal compressibility, respiratory phasicity and response to augmentation. Calf Veins: No evidence of thrombus. Normal compressibility and flow on color Doppler imaging. Superficial Great Saphenous Vein: No evidence of thrombus. Normal compressibility. Venous Reflux:  None. Other Findings: Note is made of an approximately 11.5 x 1.8 x 4.9 cm fluid collection with the subcutaneous tissues about lateral aspect of the right. IMPRESSION: 1. No evidence of DVT within the right lower extremity. 2. Incidental note made of an approximately 11.5 cm fluid collection within the subcutaneous tissues about the lateral aspect of the right calf, nonspecific with differential considerations including hematoma, seroma and abscess. Clinical correlation is advised. Electronically Signed   By: Simonne Come M.D.   On: 06/30/2019 12:22     CBC Recent Labs  Lab 06/30/19 1343 07/01/19 0640  WBC 10.0 9.9  HGB 15.1* 13.9  HCT 45.7 42.0  PLT 179 171  MCV 97.9 99.1  MCH 32.3  32.8  MCHC 33.0 33.1  RDW 14.2 14.3  LYMPHSABS 1.6  --   MONOABS 0.7  --   EOSABS 0.1  --   BASOSABS 0.1  --     Chemistries  Recent Labs  Lab 06/30/19 1343 07/01/19 0040 07/01/19 0640  NA 140  --  138  K 2.7* 2.8* 3.1*  CL 96*  --  100  CO2 33*  --  28  GLUCOSE 93  --  93  BUN 21*  --  17  CREATININE 0.79  --  0.70  CALCIUM 8.9  --  8.5*  MG 1.8  --   --   AST  --   --  35  ALT  --   --  25  ALKPHOS  --   --  110  BILITOT  --   --  1.4*   ------------------------------------------------------------------------------------------------------------------ No results for input(s): CHOL, HDL, LDLCALC, TRIG, CHOLHDL, LDLDIRECT in the last 72 hours.  Lab Results  Component Value Date   HGBA1C 6.4 (H) 06/30/2019   ------------------------------------------------------------------------------------------------------------------ Recent Labs    06/30/19 1343  TSH 3.873    ------------------------------------------------------------------------------------------------------------------ No results for input(s): VITAMINB12, FOLATE, FERRITIN, TIBC, IRON, RETICCTPCT in the last 72 hours.  Coagulation profile No results for input(s): INR, PROTIME in the last 168 hours.  No results for input(s): DDIMER in the last 72 hours.  Cardiac Enzymes No results for input(s): CKMB, TROPONINI, MYOGLOBIN in the last 168 hours.  Invalid input(s): CK ------------------------------------------------------------------------------------------------------------------ No results found for: BNP   Jaclyn Klein M.D on 07/01/2019 at 5:34 PM  Go to [email or web address] - for contact info  [facility] - Office  [phone]

## 2019-07-02 DIAGNOSIS — I10 Essential (primary) hypertension: Secondary | ICD-10-CM | POA: Diagnosis present

## 2019-07-02 DIAGNOSIS — E785 Hyperlipidemia, unspecified: Secondary | ICD-10-CM | POA: Diagnosis present

## 2019-07-02 DIAGNOSIS — U071 COVID-19: Secondary | ICD-10-CM | POA: Diagnosis present

## 2019-07-02 LAB — GLUCOSE, CAPILLARY
Glucose-Capillary: 105 mg/dL — ABNORMAL HIGH (ref 70–99)
Glucose-Capillary: 71 mg/dL (ref 70–99)
Glucose-Capillary: 111 mg/dL — ABNORMAL HIGH (ref 70–99)
Glucose-Capillary: 103 mg/dL — ABNORMAL HIGH (ref 70–99)

## 2019-07-02 NOTE — Progress Notes (Signed)
Patient Demographics:    Jaclyn Klein, is a 53 y.o. female, DOB - 1966/03/24, IZT:[id number]  Admit date - 06/30/2019   Admitting Physician Lilyan Gilford, DO  Outpatient Primary MD for the patient is Eartha Inch, MD  LOS - 1   Chief Complaint  Patient presents with  . Cellulitis        Subjective:    Jaclyn Klein today has no fevers, no emesis,  No chest pain,   --Right lower extremity swelling and pain is Not worse -sero-sanguinous drainage persist  No Nausea, Vomiting or Diarrhea   Assessment  & Plan :    Active Problems:   Rt Cellulitis and abscess of leg   Rt Leg pain   Morbid obesity with BMI of 50.0-59.9, adult (HCC)   COVID-19 virus RNA detected--04/20/2019   HTN (hypertension)   HLD (hyperlipidemia)  Brief Summary:- 54 y.o. female, with history of hypothyroidism, HTN, HLD and morbid obesity admitted on 06/30/2019 with worsening right lower extremity cellulitis after failing outpatient doxycycline  A/p 1)Right lower extremity cellulitis and Abscess--- please see CT tibia-fibula and ultrasound of right lower extremity report dated 06/30/2019 -Failed outpatient doxycycline,  --continue IV vancomycin pending wound and blood culture results -venous doppler w/o  evidence of DVT -Gram stain with GPC in chains, culture pending  2)Morbid obesity--BMI over 50, this complicates overall care  3)HLD---  continue Lipitor  4)HTN--- stable, continue lisinopril 20 mg daily  5)Hypothyroidism--stable, continue levothyroxine  6) recent COVID-19 infection--- patient tested positive on 04/20/2019 currently asymptomatic  Disposition/Need for in-Hospital Stay- patient unable to be discharged at this time due to right lower extremity abscess requiring IV antibiotics after failing outpatient oral antibiotics*  Code Status : full   Family Communication:   NA (patient is alert,  awake and coherent)  Disposition Plan  : home  Consults  : na  DVT Prophylaxis  :  Lovenox  - SCD  Lab Results  Component Value Date   PLT 171 07/01/2019    Inpatient Medications  Scheduled Meds: . atorvastatin  80 mg Oral Daily  . enoxaparin (LOVENOX) injection  70 mg Subcutaneous Q24H  . insulin aspart  0-20 Units Subcutaneous TID WC  . levothyroxine  100 mcg Oral Daily  . lidocaine-EPINEPHrine  20 mL Intradermal Once  . lisinopril  20 mg Oral Daily  . pantoprazole  40 mg Oral Daily  . potassium chloride  20 mEq Oral Daily  . povidone-Iodine   Topical Once   Continuous Infusions: . vancomycin 1,500 mg (07/02/19 0529)   PRN Meds:.acetaminophen **OR** acetaminophen, ibuprofen, LORazepam, Melatonin, ondansetron **OR** ondansetron (ZOFRAN) IV, polyethylene glycol, promethazine    Anti-infectives (From admission, onward)   Start     Dose/Rate Route Frequency Ordered Stop   07/01/19 1700  vancomycin (VANCOREADY) IVPB 1500 mg/300 mL     1,500 mg 150 mL/hr over 120 Minutes Intravenous Every 12 hours 07/01/19 1332     07/01/19 0400  vancomycin (VANCOREADY) IVPB 1250 mg/250 mL  Status:  Discontinued     1,250 mg 166.7 mL/hr over 90 Minutes Intravenous Every 12 hours 06/30/19 1604 07/01/19 1332   06/30/19 1615  vancomycin (VANCOREADY) IVPB 2000 mg/400 mL     2,000 mg 200 mL/hr over 120 Minutes  Intravenous  Once 06/30/19 1603 06/30/19 2229        Objective:   Vitals:   07/01/19 0511 07/01/19 1309 07/01/19 2207 07/02/19 0530  BP: 125/77 122/84 (!) 149/85 (!) 137/99  Pulse: 83 80 75 (!) 101  Resp: 17 17 16 16   Temp: 98.1 F (36.7 C) 98.9 F (37.2 C) 98.3 F (36.8 C) 98 F (36.7 C)  TempSrc:  Oral Oral   SpO2: 93% 97% 97% 97%  Weight:      Height:        Wt Readings from Last 3 Encounters:  06/30/19 (!) 144.7 kg  06/23/19 136.1 kg  08/26/15 125.6 kg     Intake/Output Summary (Last 24 hours) at 07/02/2019 0102 Last data filed at 07/02/2019 0529 Gross per 24  hour  Intake 720 ml  Output --  Net 720 ml   Physical Exam  Gen:- Awake Alert, morbidly obese HEENT:- [name], No sclera icterus Neck-Supple Neck,No JVD,.  Lungs-  CTAB , fair symmetrical air movement CV- S1, S2 normal, regular  Abd-  +ve B.Sounds, Abd Soft, No tenderness, increased truncal adiposity Psych-affect is appropriate, oriented x3 Neuro-no new focal deficits, no tremors Rt Leg- Right lower extremity swelling and pain is Not worse -sero-sanguinous drainage persist --please see photos in epic below  Media Information   Document Information  Photos    07/01/2019 10:10  Attached To:  Hospital Encounter on 06/30/19  Source Information  Roxan Hockey, MD  Ap-Dept 300     Data Review:   Micro Results Recent Results (from the past 240 hour(s))  Blood Culture x 1     Status: None (Preliminary result)   Collection Time: 06/30/19  4:29 PM   Specimen: Left Antecubital; Blood  Result Value Ref Range Status   Specimen Description LEFT ANTECUBITAL  Final   Special Requests   Final    BOTTLES DRAWN AEROBIC AND ANAEROBIC Blood Culture adequate volume   Culture   Final    NO GROWTH 2 DAYS Performed at [facility], [address], [place], [place] [address]    Report Status PENDING  Incomplete  SARS CORONAVIRUS 2 (TAT 6-24 HRS) Nasopharyngeal Nasopharyngeal Swab     Status: None   Collection Time: 06/30/19  4:38 PM   Specimen: Nasopharyngeal Swab  Result Value Ref Range Status   SARS Coronavirus 2 NEGATIVE NEGATIVE Final    Comment: (NOTE) SARS-CoV-2 target nucleic acids are NOT DETECTED. The SARS-CoV-2 RNA is generally detectable in upper and lower respiratory specimens during the acute phase of infection. Negative results do not preclude SARS-CoV-2 infection, do not rule out co-infections with other pathogens, and should not be used as the sole basis for treatment or other patient management decisions. Negative results must be combined with clinical  observations, patient history, and epidemiological information. The expected result is Negative. Fact Sheet for Patients: SugarRoll.be Fact Sheet for Healthcare Providers: https://www.woods-mathews.com/ This test is not yet approved or cleared by the [place] FDA and  has been authorized for detection and/or diagnosis of SARS-CoV-2 by FDA under an Emergency Use Authorization (EUA). This EUA will remain  in effect (meaning this test can be used) for the duration of the COVID-19 declaration under Section 56 4(b)(1) of the Act, 21 U.S.C. section 360bbb-3(b)(1), unless the authorization is terminated or revoked sooner. Performed at [facility], [place] [address] [place], [place] [address]   Aerobic Culture (superficial specimen)     Status: None (Preliminary result)   Collection Time: 07/01/19  10:29 AM   Specimen: Leg; Wound  Result Value Ref Range Status   Specimen Description   Final    LEG Performed at [facility], [address] [place], [place] [address]    Special Requests   Final    WOUND RIGHT LEG Performed at [facility], [address] [place], [place] [address]    Gram Stain   Final    FEW WBC PRESENT, PREDOMINANTLY PMN RARE GRAM POSITIVE COCCI IN CHAINS Performed at [facility], [address] [place], [place] [address]    Culture PENDING  Incomplete   Report Status PENDING  Incomplete    Radiology Reports CT TIBIA FIBULA RIGHT W CONTRAST  Result Date: 06/30/2019 CLINICAL DATA:  Right lower extremity edema, erythema and pain for 1 week. Evaluate for abscess or deep space infection. EXAM: CT OF THE LOWER RIGHT EXTREMITY WITH CONTRAST TECHNIQUE: Multidetector CT imaging of the right lower leg was performed according to the standard protocol following intravenous contrast administration. COMPARISON:  Doppler ultrasound same date. CONTRAST:  75mL OMNIPAQUE IOHEXOL 300 MG/ML  SOLN FINDINGS: Bones/Joint/Cartilage  No evidence of acute fracture, dislocation or bone destruction. There are tricompartmental degenerative changes at the knee, most advanced in the medial compartment. There are probable small intra-articular loose bodies. No significant knee joint effusion. Mild degenerative changes are present at the ankle with subchondral cyst formation medially in the talar dome. Ligaments Suboptimally assessed by CT. No gross abnormalities. Muscles and Tendons Unremarkable. The extensor mechanism appears intact at the knee. The Achilles tendon is intact. Soft tissues There is a peripherally enhancing lenticular shaped fluid collection laterally in the proximal aspect of the lower leg. This measures up to 11.1 cm in length and up to 4.3 x 2.3 cm transverse. This collection lies superficial to the anterior and lateral compartment musculature and is located within the subcutaneous fat. There is diffuse edema throughout the subcutaneous fat with mild dermal thickening. No foreign body or soft tissue emphysema. No other focal fluid collections are seen. IMPRESSION: 1. Peripherally enhancing lenticular shaped fluid collection laterally in the proximal aspect of the lower leg, consistent with an abscess in the appropriate clinical context. This could reflect a subacute hematoma or complex seroma. 2. No evidence of osteomyelitis or septic joint. 3. Tricompartmental degenerative changes at the knee, most advanced in the medial compartment, with probable small intra-articular loose bodies. Subchondral cyst formation medially in the talar dome. Electronically Signed   By: Carey BullocksWilliam  Veazey M.D.   On: 06/30/2019 17:59   US Venous Img Lower Unilateral Right  Result Date: 06/30/2019 CLINICAL DATA:  Right lower extremity pain and edema for the past 3 weeks. Evaluate for DVT. EXAM: RIGHT LOWER EXTREMITY VENOUS DOPPLER ULTRASOUND TECHNIQUE: Gray-scale sonography with graded compression, as well as color Doppler and duplex ultrasound were  performed to evaluate the lower extremity deep venous systems from the level of the common femoral vein and including the common femoral, femoral, profunda femoral, popliteal and calf veins including the posterior tibial, peroneal and gastrocnemius veins when visible. The superficial great saphenous vein was also interrogated. Spectral Doppler was utilized to evaluate flow at rest and with distal augmentation maneuvers in the common femoral, femoral and popliteal veins. COMPARISON:  None. FINDINGS: Contralateral Common Femoral Vein: Respiratory phasicity is normal and symmetric with the symptomatic side. No evidence of thrombus. Normal compressibility. Common Femoral Vein: No evidence of thrombus. Normal compressibility, respiratory phasicity and response to augmentation. Saphenofemoral Junction: No evidence of thrombus. Normal compressibility  and flow on color Doppler imaging. Profunda Femoral Vein: No evidence of thrombus. Normal compressibility and flow on color Doppler imaging. Femoral Vein: No evidence of thrombus. Normal compressibility, respiratory phasicity and response to augmentation. Popliteal Vein: No evidence of thrombus. Normal compressibility, respiratory phasicity and response to augmentation. Calf Veins: No evidence of thrombus. Normal compressibility and flow on color Doppler imaging. Superficial Great Saphenous Vein: No evidence of thrombus. Normal compressibility. Venous Reflux:  None. Other Findings: Note is made of an approximately 11.5 x 1.8 x 4.9 cm fluid collection with the subcutaneous tissues about lateral aspect of the right. IMPRESSION: 1. No evidence of DVT within the right lower extremity. 2. Incidental note made of an approximately 11.5 cm fluid collection within the subcutaneous tissues about the lateral aspect of the right calf, nonspecific with differential considerations including hematoma, seroma and abscess. Clinical correlation is advised. Electronically Signed   By: Simonne Come M.D.   On: 06/30/2019 12:22     CBC Recent Labs  Lab 06/30/19 1343 07/01/19 0640  WBC 10.0 9.9  HGB 15.1* 13.9  HCT 45.7 42.0  PLT 179 171  MCV 97.9 99.1  MCH 32.3 32.8  MCHC 33.0 33.1  RDW 14.2 14.3  LYMPHSABS 1.6  --   MONOABS 0.7  --   EOSABS 0.1  --   BASOSABS 0.1  --     Chemistries  Recent Labs  Lab 06/30/19 1343 07/01/19 0040 07/01/19 0640  NA 140  --  138  K 2.7* 2.8* 3.1*  CL 96*  --  100  CO2 33*  --  28  GLUCOSE 93  --  93  BUN 21*  --  17  CREATININE 0.79  --  0.70  CALCIUM 8.9  --  8.5*  MG 1.8  --   --   AST  --   --  35  ALT  --   --  25  ALKPHOS  --   --  110  BILITOT  --   --  1.4*   ------------------------------------------------------------------------------------------------------------------ No results for input(s): CHOL, HDL, LDLCALC, TRIG, CHOLHDL, LDLDIRECT in the last 72 hours.  Lab Results  Component Value Date   HGBA1C 6.4 (H) 06/30/2019   ------------------------------------------------------------------------------------------------------------------ Recent Labs    06/30/19 1343  TSH 3.873   ------------------------------------------------------------------------------------------------------------------ No results for input(s): VITAMINB12, FOLATE, FERRITIN, TIBC, IRON, RETICCTPCT in the last 72 hours.  Coagulation profile No results for input(s): INR, PROTIME in the last 168 hours.  No results for input(s): DDIMER in the last 72 hours.  Cardiac Enzymes No results for input(s): CKMB, TROPONINI, MYOGLOBIN in the last 168 hours.  Invalid input(s): CK ------------------------------------------------------------------------------------------------------------------ No results found for: BNP   Shon Hale M.D on 07/02/2019 at 9:09 AM  Go to [email or web address] - for contact info  [facility] - Office  [phone]

## 2019-07-03 LAB — GLUCOSE, CAPILLARY
Glucose-Capillary: 86 mg/dL (ref 70–99)
Glucose-Capillary: 95 mg/dL (ref 70–99)
Glucose-Capillary: 124 mg/dL — ABNORMAL HIGH (ref 70–99)
Glucose-Capillary: 83 mg/dL (ref 70–99)

## 2019-07-03 LAB — CREATININE, SERUM
Creatinine, Ser: 0.74 mg/dL (ref 0.44–1.00)
GFR calc Af Amer: >60 mL/min (ref >60)
GFR calc non Af Amer: >60 mL/min (ref >60)

## 2019-07-03 MED ORDER — ACETAMINOPHEN 325 MG PO TABS: 650.0000 mg | ORAL_TABLET | Freq: Four times a day (QID) | ORAL | 0 refills | Status: DC | PRN

## 2019-07-03 MED ORDER — SULFAMETHOXAZOLE-TRIMETHOPRIM 800-160 MG PO TABS: 1.0000 | ORAL_TABLET | Freq: Two times a day (BID) | ORAL | 0 refills | Status: AC

## 2019-07-03 MED ORDER — CEPHALEXIN 500 MG PO CAPS: 500.0000 mg | ORAL_CAPSULE | Freq: Three times a day (TID) | ORAL | 0 refills | Status: AC

## 2019-07-03 NOTE — Progress Notes (Signed)
Patient states understanding of discharge instructions.  

## 2019-07-03 NOTE — Discharge Instructions (Signed)
1) take Bactrim/sulfa antibiotic and Keflex as prescribed for the right leg wound infection 2) follow-up with your primary care physician within a week for recheck and reevaluation

## 2019-07-03 NOTE — Discharge Summary (Signed)
Jaclyn Klein, is a 53 y.o. female  DOB 08-Jun-1966  MRN [id number].  Admission date:  06/30/2019  Admitting Physician  Lilyan GilfordAsia B Zierle-Ghosh, DO  Discharge Date:  07/03/2019   Primary MD  Eartha InchBadger, Michael C, MD  Recommendations for primary care physician for things to follow:   1)Take Bactrim/Sulfa antibiotic and Keflex as prescribed for the right leg wound infection 2) follow-up with your primary care physician within a week for recheck and reevaluation  Admission Diagnosis  Cellulitis and abscess of leg [L03.119, L02.419] Leg pain [M79.606]   Discharge Diagnosis  Cellulitis and abscess of leg [L03.119, L02.419] Leg pain [M79.606]   Active Problems:   Rt Cellulitis and abscess of leg   Rt Leg pain   Morbid obesity with BMI of 50.0-59.9, adult (HCC)   COVID-19 virus RNA detected--04/20/2019   HTN (hypertension)   HLD (hyperlipidemia)      Past Medical History:  Diagnosis Date  . Hyperlipidemia   . Hypertension     Past Surgical History:  Procedure Laterality Date  . KNEE ARTHROSCOPY WITH MEDIAL MENISECTOMY Right 08/22/2015   Procedure: KNEE ARTHROSCOPY WITH MEDIAL MENISECTOMY, PATELLAR AND FEMUR CHONDROPLASTY;  Surgeon: Vickki HearingStanley E Harrison, MD;  Location: [facility];  Service: Orthopedics;  Laterality: Right;  . NO PAST SURGERIES       HPI  from the history and physical done on the day of admission:    Jaclyn Klein  is a 53 y.o. female, with history of hypothyroidism, HTN, HLD and more presents to the ER today for a chief complaint of leg pain. Patient was here a week ago, and was diagnosed with likely cellulitis. She was sent home with a script for Doxycycline and advised to come back the next day for an US to r/o DVT. Patient came back today, which is 7 days later. She has had leg swelling, erythema and irritation for a total of 9 days now. Patient reports that it started with erythema,  progressed to swelling, and then became very tender to palpation. She has tried to use ibuprofen, but can't tell if it has helped with the pain or not. Patient reports that when the leg is at rest with no palpation the pain is 0/10. At its worst it is 6-7/10. She has not noticed any drainage from the area. She has associated subjective fever and chills, as well as decreased appetite, and "some" shortness of breath. She reports that she also had loose stools one time daily for 3 days last week. The loose stools were likely due to the doxycycline. Patient has felt fatigued, and does report a cough. The cough is dry and has been present for several months. Patient is on lisinopril, but reports that the cough is not associated with the lisinopril.  Patient drinks a "few" glasses of wine daily. She reports that her last drink was 12 days ago, because she did not want to drink and take antibiotic at the same time    Hospital Course:   Brief Summary:- 53 y.o.female,with  history of hypothyroidism, HTN, HLD and morbid obesity admitted on 06/30/2019 with worsening right lower extremity cellulitis after failing outpatient doxycycline  A/p 1)Right lower extremity cellulitis and Abscess--- please see CT tibia-fibula and ultrasound of right lower extremity report dated 06/30/2019 -Failed outpatient doxycycline,  -- Treated with IV vancomycin  -wound culture with group G strep--- started on Keflex and Bactrim -blood culture NGTD -venous doppler w/o  evidence of DVT  2)Morbid obesity--BMI over 50, this complicates overall care  3)HLD---  continue Lipitor  4)HTN--- stable, continue lisinopril 20 mg daily  5)Hypothyroidism--stable, continue levothyroxine  6) recent COVID-19 infection--- patient tested positive on 04/20/2019 currently asymptomatic  Disposition--- Home  Code Status : full   Family Communication:   NA (patient is alert, awake and coherent)  Disposition Plan  :  home  Consults  : na  Discharge Condition: stable  Follow UP--PCP within a week for recheck and reevaluation  Diet and Activity recommendation:  As advised  Discharge Instructions    Discharge Instructions    Call MD for:  difficulty breathing, headache or visual disturbances   Complete by: As directed    Call MD for:  persistant dizziness or light-headedness   Complete by: As directed    Call MD for:  persistant nausea and vomiting   Complete by: As directed    Call MD for:  temperature >100.4   Complete by: As directed    Diet - low sodium heart healthy   Complete by: As directed    Discharge instructions   Complete by: As directed    1) take Bactrim/sulfa antibiotic and Keflex as prescribed for the right leg wound infection 2) follow-up with your primary care physician within a week for recheck and reevaluation   Increase activity slowly   Complete by: As directed       Discharge Medications     Allergies as of 07/03/2019   No Known Allergies     Medication List    STOP taking these medications   doxycycline 100 MG capsule Commonly known as: VIBRAMYCIN   ibuprofen 800 MG tablet Commonly known as: ADVIL   lovastatin 20 MG tablet Commonly known as: MEVACOR     TAKE these medications   acetaminophen 325 MG tablet Commonly known as: TYLENOL Take 2 tablets (650 mg total) by mouth every 6 (six) hours as needed for mild pain (or Fever >/= 101).   atorvastatin 80 MG tablet Commonly known as: LIPITOR Take 1 tablet by mouth daily.   cephALEXin 500 MG capsule Commonly known as: Keflex Take 1 capsule (500 mg total) by mouth 3 (three) times daily for 7 days.   esomeprazole 40 MG capsule Commonly known as: NEXIUM Take 40 mg by mouth daily at 12 noon.   levothyroxine 100 MCG tablet Commonly known as: SYNTHROID Take 1 tablet by mouth daily.   lisinopril-hydrochlorothiazide 20-12.5 MG tablet Commonly known as: ZESTORETIC Take 2 tablets by mouth daily.    LYZA PO Take by mouth.   naproxen sodium 220 MG tablet Commonly known as: ALEVE Take 220 mg by mouth 2 (two) times daily with a meal.   promethazine 12.5 MG tablet Commonly known as: PHENERGAN Take 1 tablet (12.5 mg total) by mouth every 6 (six) hours as needed for nausea or vomiting.   sulfamethoxazole-trimethoprim 800-160 MG tablet Commonly known as: BACTRIM DS Take 1 tablet by mouth 2 (two) times daily for 7 days.      Major procedures and Radiology Reports - PLEASE review detailed  and final reports for all details, in brief -   CT TIBIA FIBULA RIGHT W CONTRAST  Result Date: 06/30/2019 CLINICAL DATA:  Right lower extremity edema, erythema and pain for 1 week. Evaluate for abscess or deep space infection. EXAM: CT OF THE LOWER RIGHT EXTREMITY WITH CONTRAST TECHNIQUE: Multidetector CT imaging of the right lower leg was performed according to the standard protocol following intravenous contrast administration. COMPARISON:  Doppler ultrasound same date. CONTRAST:  54mL OMNIPAQUE IOHEXOL 300 MG/ML  SOLN FINDINGS: Bones/Joint/Cartilage No evidence of acute fracture, dislocation or bone destruction. There are tricompartmental degenerative changes at the knee, most advanced in the medial compartment. There are probable small intra-articular loose bodies. No significant knee joint effusion. Mild degenerative changes are present at the ankle with subchondral cyst formation medially in the talar dome. Ligaments Suboptimally assessed by CT. No gross abnormalities. Muscles and Tendons Unremarkable. The extensor mechanism appears intact at the knee. The Achilles tendon is intact. Soft tissues There is a peripherally enhancing lenticular shaped fluid collection laterally in the proximal aspect of the lower leg. This measures up to 11.1 cm in length and up to 4.3 x 2.3 cm transverse. This collection lies superficial to the anterior and lateral compartment musculature and is located within the subcutaneous  fat. There is diffuse edema throughout the subcutaneous fat with mild dermal thickening. No foreign body or soft tissue emphysema. No other focal fluid collections are seen. IMPRESSION: 1. Peripherally enhancing lenticular shaped fluid collection laterally in the proximal aspect of the lower leg, consistent with an abscess in the appropriate clinical context. This could reflect a subacute hematoma or complex seroma. 2. No evidence of osteomyelitis or septic joint. 3. Tricompartmental degenerative changes at the knee, most advanced in the medial compartment, with probable small intra-articular loose bodies. Subchondral cyst formation medially in the talar dome. Electronically Signed   By: Carey Bullocks M.D.   On: 06/30/2019 17:59   US Venous Img Lower Unilateral Right  Result Date: 06/30/2019 CLINICAL DATA:  Right lower extremity pain and edema for the past 3 weeks. Evaluate for DVT. EXAM: RIGHT LOWER EXTREMITY VENOUS DOPPLER ULTRASOUND TECHNIQUE: Gray-scale sonography with graded compression, as well as color Doppler and duplex ultrasound were performed to evaluate the lower extremity deep venous systems from the level of the common femoral vein and including the common femoral, femoral, profunda femoral, popliteal and calf veins including the posterior tibial, peroneal and gastrocnemius veins when visible. The superficial great saphenous vein was also interrogated. Spectral Doppler was utilized to evaluate flow at rest and with distal augmentation maneuvers in the common femoral, femoral and popliteal veins. COMPARISON:  None. FINDINGS: Contralateral Common Femoral Vein: Respiratory phasicity is normal and symmetric with the symptomatic side. No evidence of thrombus. Normal compressibility. Common Femoral Vein: No evidence of thrombus. Normal compressibility, respiratory phasicity and response to augmentation. Saphenofemoral Junction: No evidence of thrombus. Normal compressibility and flow on color Doppler  imaging. Profunda Femoral Vein: No evidence of thrombus. Normal compressibility and flow on color Doppler imaging. Femoral Vein: No evidence of thrombus. Normal compressibility, respiratory phasicity and response to augmentation. Popliteal Vein: No evidence of thrombus. Normal compressibility, respiratory phasicity and response to augmentation. Calf Veins: No evidence of thrombus. Normal compressibility and flow on color Doppler imaging. Superficial Great Saphenous Vein: No evidence of thrombus. Normal compressibility. Venous Reflux:  None. Other Findings: Note is made of an approximately 11.5 x 1.8 x 4.9 cm fluid collection with the subcutaneous tissues about lateral aspect of the right.  IMPRESSION: 1. No evidence of DVT within the right lower extremity. 2. Incidental note made of an approximately 11.5 cm fluid collection within the subcutaneous tissues about the lateral aspect of the right calf, nonspecific with differential considerations including hematoma, seroma and abscess. Clinical correlation is advised. Electronically Signed   By: Simonne Come M.D.   On: 06/30/2019 12:22   Micro Results   Recent Results (from the past 240 hour(s))  Blood Culture x 1     Status: None (Preliminary result)   Collection Time: 06/30/19  4:29 PM   Specimen: Left Antecubital; Blood  Result Value Ref Range Status   Specimen Description LEFT ANTECUBITAL  Final   Special Requests   Final    BOTTLES DRAWN AEROBIC AND ANAEROBIC Blood Culture adequate volume   Culture   Final    NO GROWTH 2 DAYS Performed at [facility], [address] [place], [place] [address]    Report Status PENDING  Incomplete  SARS CORONAVIRUS 2 (TAT 6-24 HRS) Nasopharyngeal Nasopharyngeal Swab     Status: None   Collection Time: 06/30/19  4:38 PM   Specimen: Nasopharyngeal Swab  Result Value Ref Range Status   SARS Coronavirus 2 NEGATIVE NEGATIVE Final    Comment: (NOTE) SARS-CoV-2 target nucleic acids are NOT DETECTED. The SARS-CoV-2 RNA  is generally detectable in upper and lower respiratory specimens during the acute phase of infection. Negative results do not preclude SARS-CoV-2 infection, do not rule out co-infections with other pathogens, and should not be used as the sole basis for treatment or other patient management decisions. Negative results must be combined with clinical observations, patient history, and epidemiological information. The expected result is Negative. Fact Sheet for Patients: HairSlick.no Fact Sheet for Healthcare Providers: quierodirigir.com This test is not yet approved or cleared by the [place] FDA and  has been authorized for detection and/or diagnosis of SARS-CoV-2 by FDA under an Emergency Use Authorization (EUA). This EUA will remain  in effect (meaning this test can be used) for the duration of the COVID-19 declaration under Section 56 4(b)(1) of the Act, 21 U.S.C. section 360bbb-3(b)(1), unless the authorization is terminated or revoked sooner. Performed at [facility], [address], [place], [place] [address]   Aerobic Culture (superficial specimen)     Status: None (Preliminary result)   Collection Time: 07/01/19 10:29 AM   Specimen: Leg; Wound  Result Value Ref Range Status   Specimen Description   Final    LEG Performed at [facility], [address], [place], [place] [address]    Special Requests   Final    WOUND RIGHT LEG Performed at [facility], [address], [place], [place] [address]    Gram Stain   Final    FEW WBC PRESENT, PREDOMINANTLY PMN RARE GRAM POSITIVE COCCI IN CHAINS    Culture   Final    CULTURE REINCUBATED FOR BETTER GROWTH Performed at [facility], [address] [place], [place] [address]    Report Status PENDING  Incomplete   Today   Subjective   Jaclyn Klein today has no new complaints No fever  Or chills  no chest pain No dyspnea on exertion   Patient has  been seen and examined prior to discharge   Objective   Blood pressure (!) 144/89, pulse 80, temperature 98.5 F (36.9 C), temperature source Oral, resp. rate 20, height  (1.626 m), weight (!) 144.7 kg, SpO2 97 %.   Intake/Output Summary (Last  24 hours) at 07/03/2019 1805 Last data filed at 07/03/2019 1700 Gross per 24 hour  Intake 540.1 ml  Output --  Net 540.1 ml   Exam Gen:- Awake Alert, morbidly obese HEENT:- [place].AT, No sclera icterus Neck-Supple Neck,No JVD,.  Lungs-  CTAB , fair symmetrical air movement CV- S1, S2 normal, regular  Abd-  +ve B.Sounds, Abd Soft, No tenderness, increased truncal adiposity Psych-affect is appropriate, oriented x3 Neuro-no new focal deficits, no tremors Rt Leg- Right lower extremity swelling, redness, warmt and pain has Improved significantly   Data Review   CBC w Diff:  Lab Results  Component Value Date   WBC 9.9 07/01/2019   HGB 13.9 07/01/2019   HCT 42.0 07/01/2019   PLT 171 07/01/2019   LYMPHOPCT 16 06/30/2019   MONOPCT 7 06/30/2019   EOSPCT 1 06/30/2019   BASOPCT 1 06/30/2019    CMP:  Lab Results  Component Value Date   NA 138 07/01/2019   K 3.1 (L) 07/01/2019   CL 100 07/01/2019   CO2 28 07/01/2019   BUN 17 07/01/2019   CREATININE 0.74 07/03/2019   PROT 6.3 (L) 07/01/2019   ALBUMIN 2.6 (L) 07/01/2019   BILITOT 1.4 (H) 07/01/2019   ALKPHOS 110 07/01/2019   AST 35 07/01/2019   ALT 25 07/01/2019   Total Discharge time is about 33 minutes  Shon Hale M.D on 07/03/2019 at 6:05 PM  Go to [email or web address] -  for contact info  [facility] - Office  [phone]

## 2019-07-04 LAB — AEROBIC CULTURE W GRAM STAIN (SUPERFICIAL SPECIMEN)

## 2019-07-05 LAB — CULTURE, BLOOD (SINGLE)
Culture: NO GROWTH
Special Requests: ADEQUATE

## 2021-05-30 IMAGING — US US EXTREM LOW VENOUS*R*
1 series · 13 of 24 positions shown · non-contrast
Comparison: None.

CLINICAL DATA: Right lower extremity pain and edema for the past 3
weeks. Evaluate for DVT.



[Series 1: us extrem low venous*right* · 0.09mm/px · 13 of 62 slices shown]
[im 1/62]
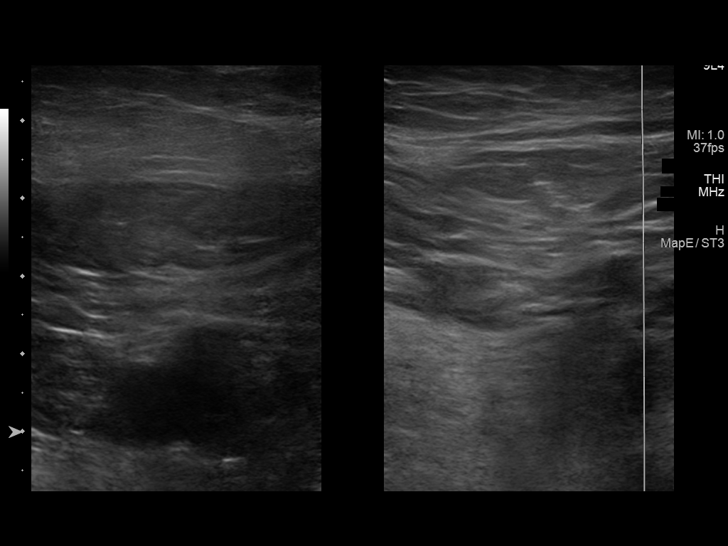
[im 6/62]
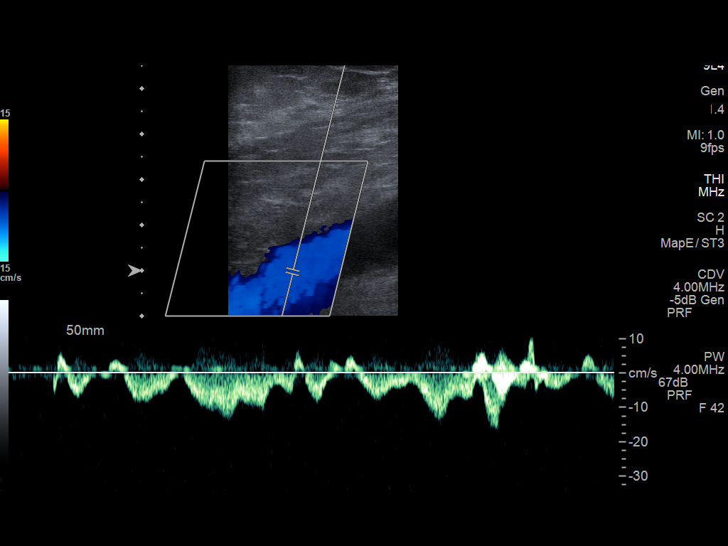
[im 11/62]
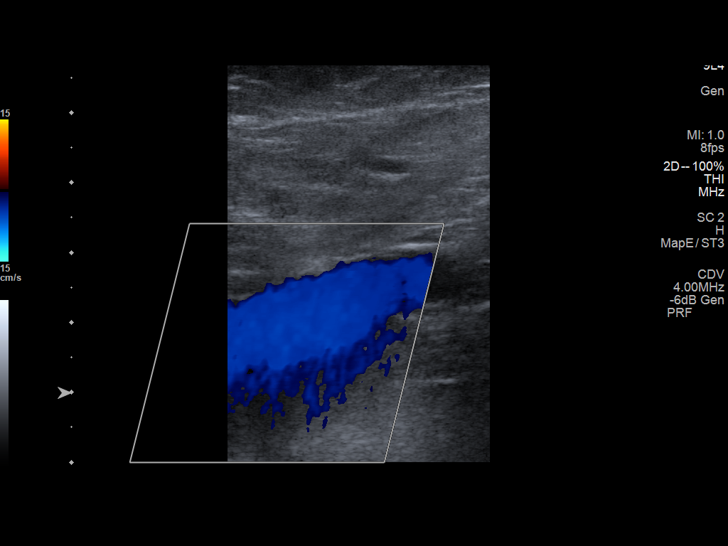
[im 16/62]
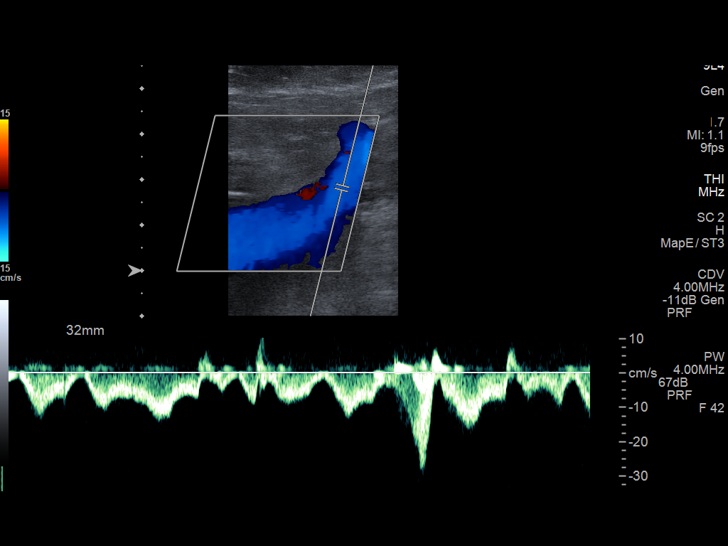
[im 22/62]
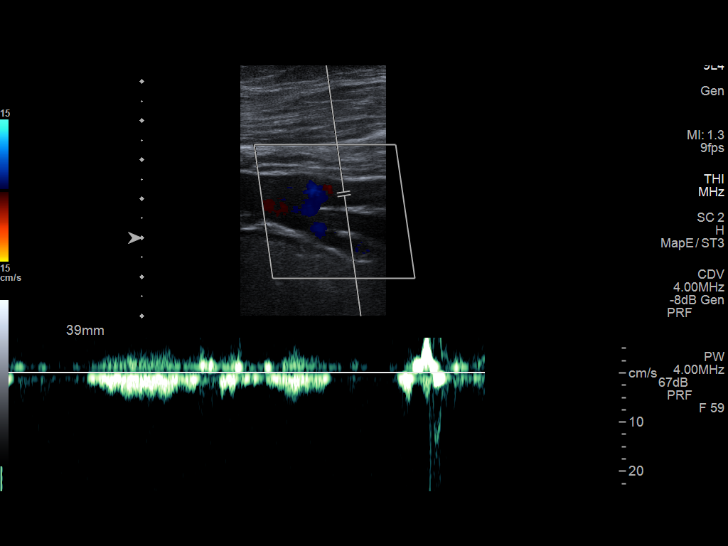
[im 27/62]
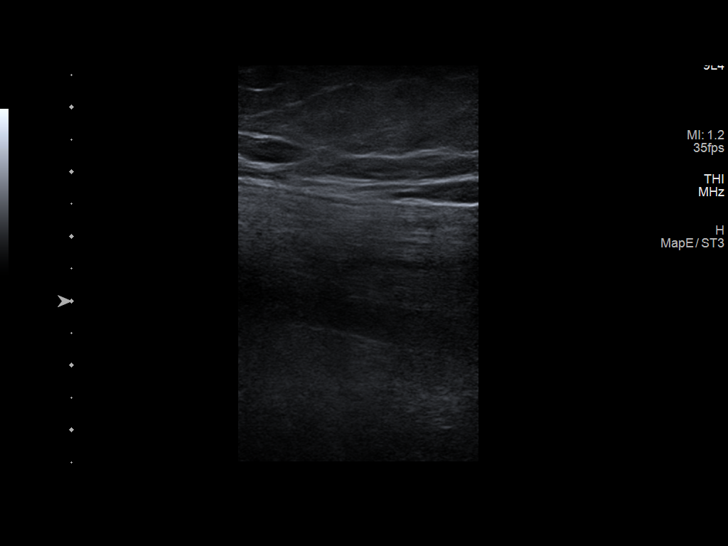
[im 32/62]
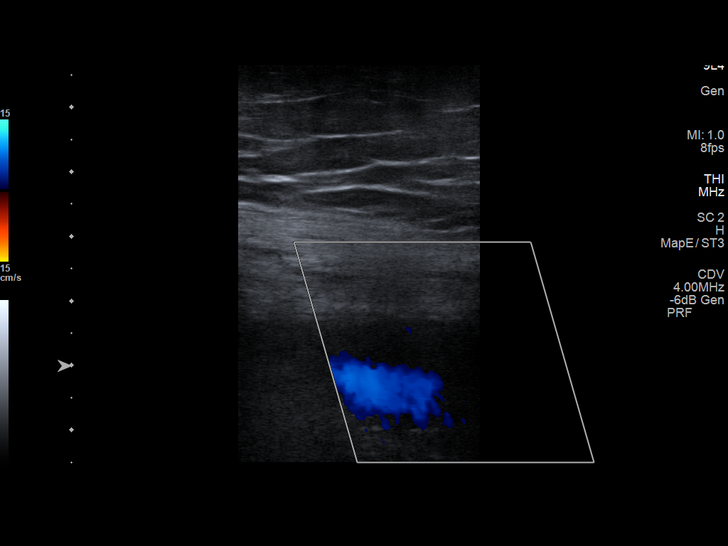
[im 35/62]
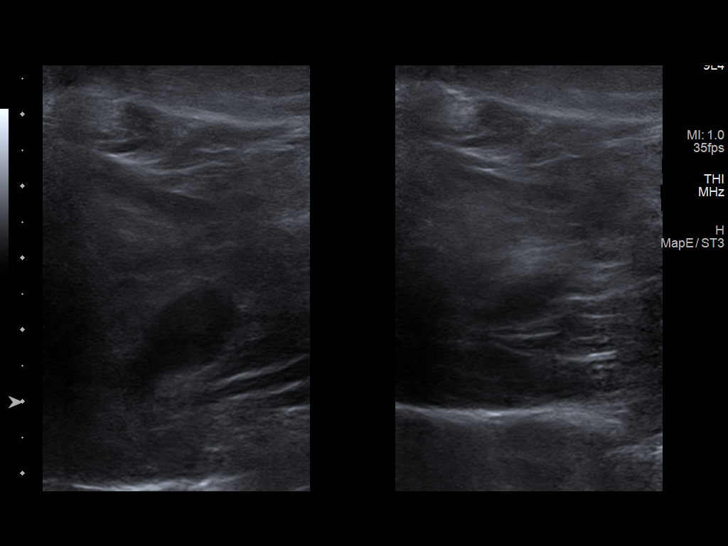
[im 40/62]
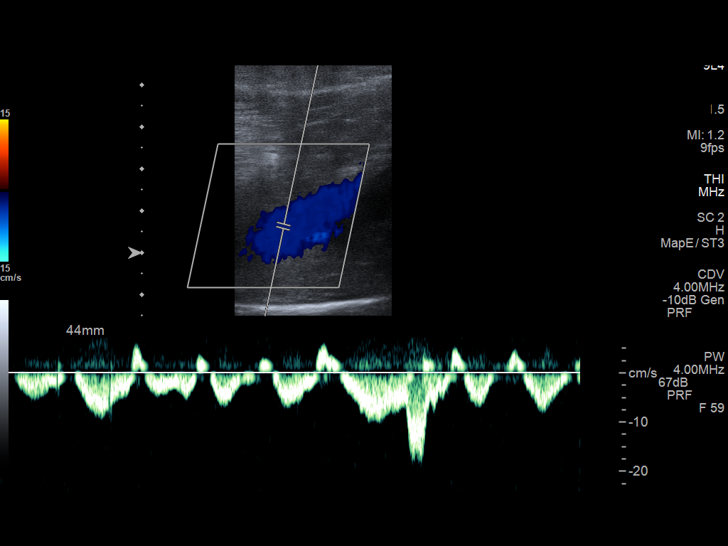
[im 46/62]
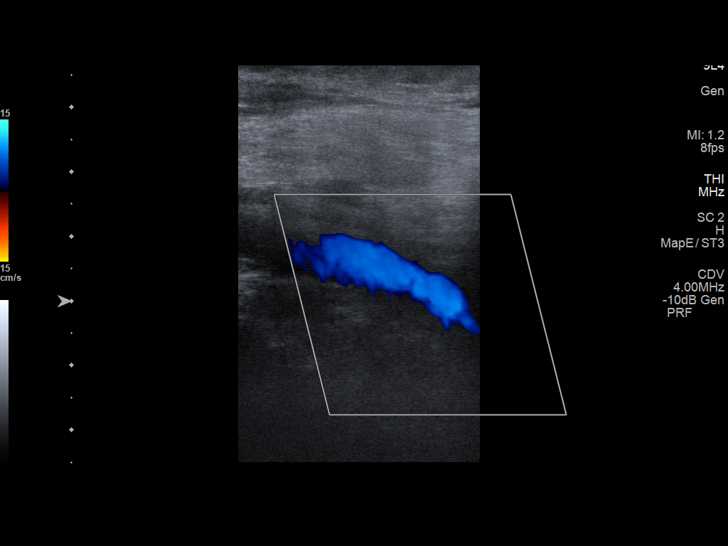
[im 51/62]
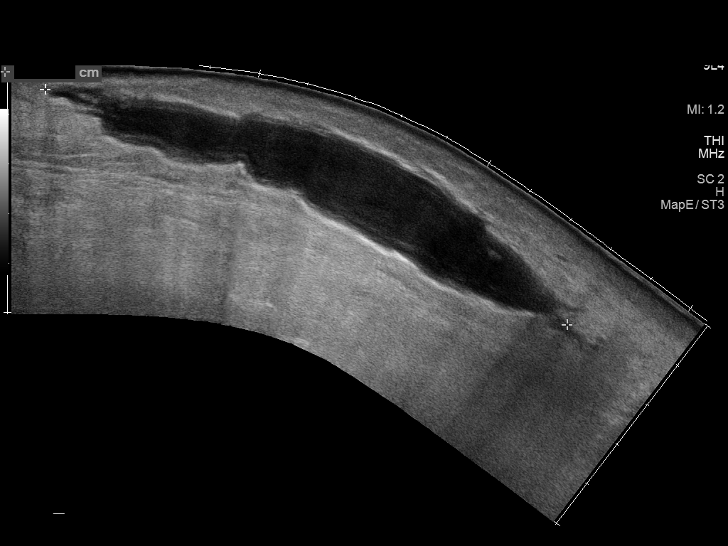
[im 56/62]
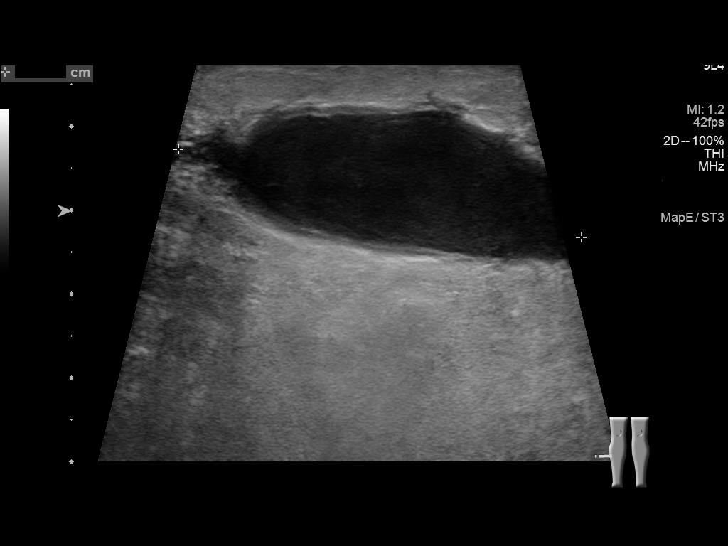
[im 62/62]
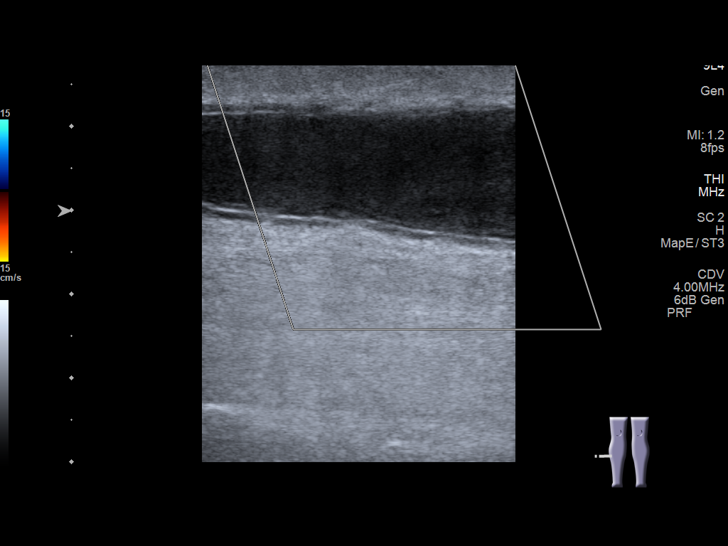

[13 of 24 positions shown; findings below may reference images not displayed]

FINDINGS: Contralateral Common Femoral Vein: Respiratory phasicity is normal
and symmetric with the symptomatic side. No evidence of thrombus.
Normal compressibility.

Common Femoral Vein: No evidence of thrombus. Normal
compressibility, respiratory phasicity and response to augmentation.

Saphenofemoral Junction: No evidence of thrombus. Normal
compressibility and flow on color Doppler imaging.

Profunda Femoral Vein: No evidence of thrombus. Normal
compressibility and flow on color Doppler imaging.

Femoral Vein: No evidence of thrombus. Normal compressibility,
respiratory phasicity and response to augmentation.

Popliteal Vein: No evidence of thrombus. Normal compressibility,
respiratory phasicity and response to augmentation.

Calf Veins: No evidence of thrombus. Normal compressibility and flow
on color Doppler imaging.

Superficial Great Saphenous Vein: No evidence of thrombus. Normal
compressibility.

Venous Reflux:  None.

Other Findings: Note is made of an approximately 11.5 x 1.8 x 4.9 cm
fluid collection with the subcutaneous tissues about lateral aspect
of the right.
IMPRESSION: 1. No evidence of DVT within the right lower extremity.
2. Incidental note made of an approximately 11.5 cm fluid collection
within the subcutaneous tissues about the lateral aspect of the
right calf, nonspecific with differential considerations including
hematoma, seroma and abscess. Clinical correlation is advised.

## 2021-05-30 IMAGING — CT CT TIBIA FIBULA *R* W/ CM
2 of 3 series · 12 of 33 positions shown, 15 images · IV contrast (agent unspecified)
Comparison: Doppler ultrasound same date.

CONTRAST:  75mL OMNIPAQUE IOHEXOL 300 MG/ML  SOLN

CLINICAL DATA: Right lower extremity edema, erythema and pain for 1
week. Evaluate for abscess or deep space infection.

EXAM:
CT OF THE LOWER RIGHT EXTREMITY WITH CONTRAST
TECHNIQUE: Multidetector CT imaging of the right lower leg was performed
according to the standard protocol following intravenous contrast
administration.

[Series 5: axial st · axial · 0.45mm/px · z∈[+270,+640]mm · 9 of 219 slices shown, 12 images]
[im 17/219  soft-tissue]
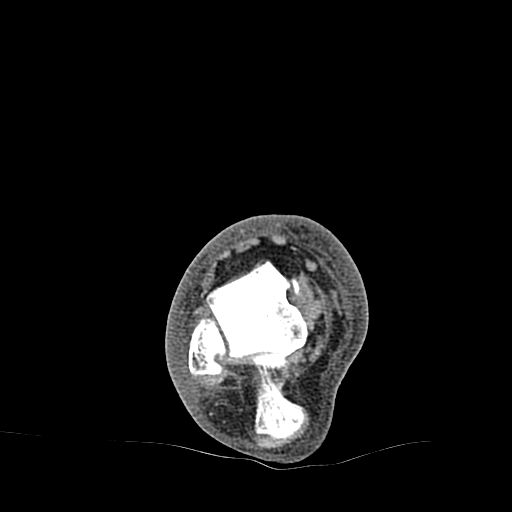
[im 17/219  bone]
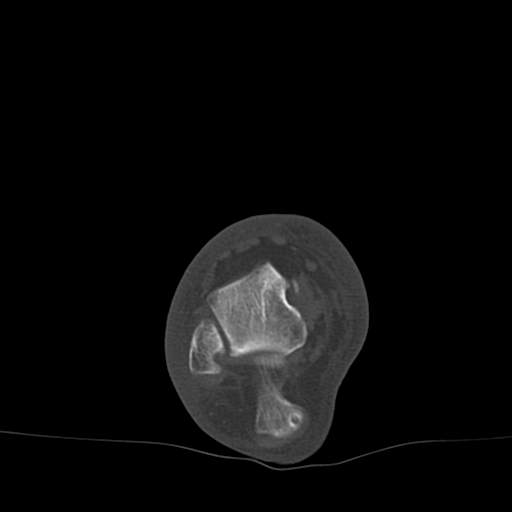
[im 51/219  bone]
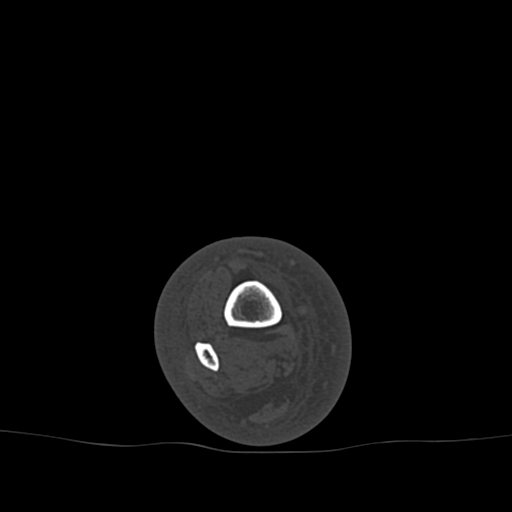
[im 68/219  bone]
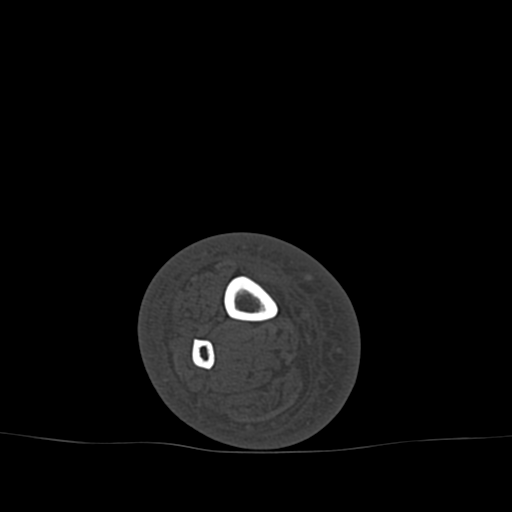
[im 84/219  bone]
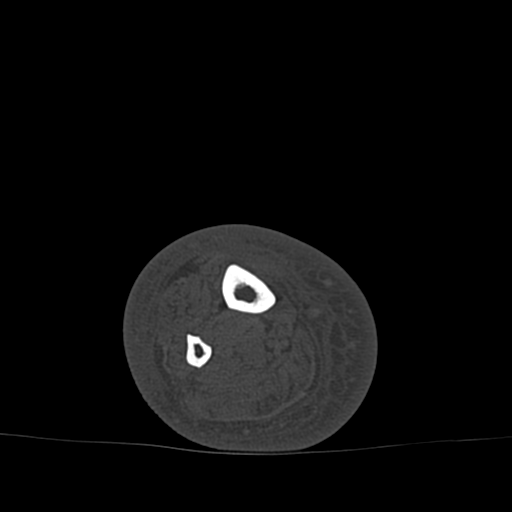
[im 118/219  soft-tissue]
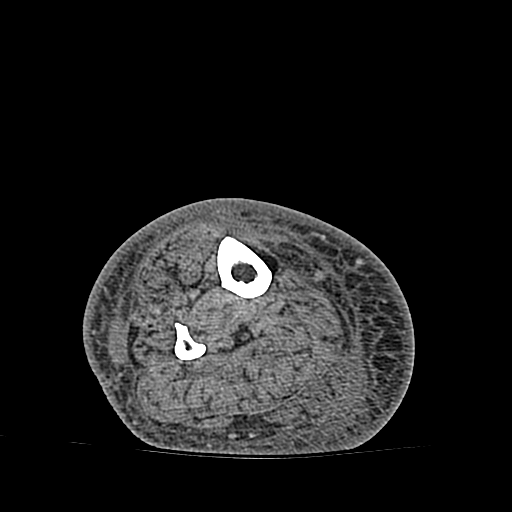
[im 118/219  bone]
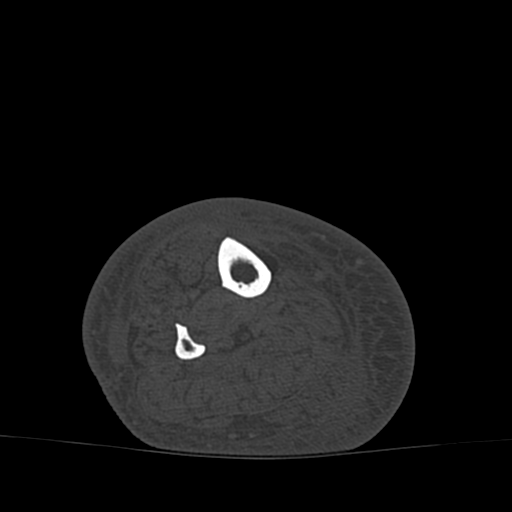
[im 135/219  bone]
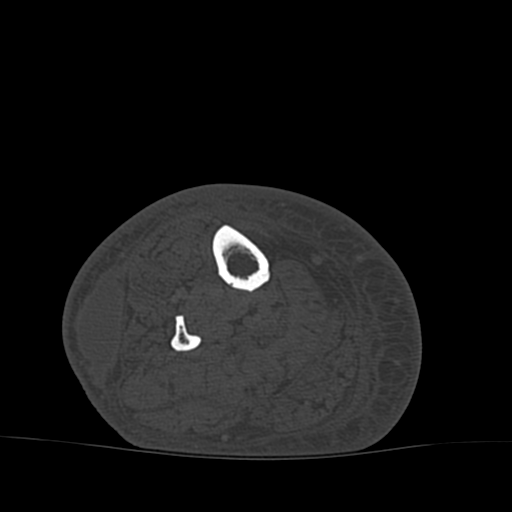
[im 151/219  bone]
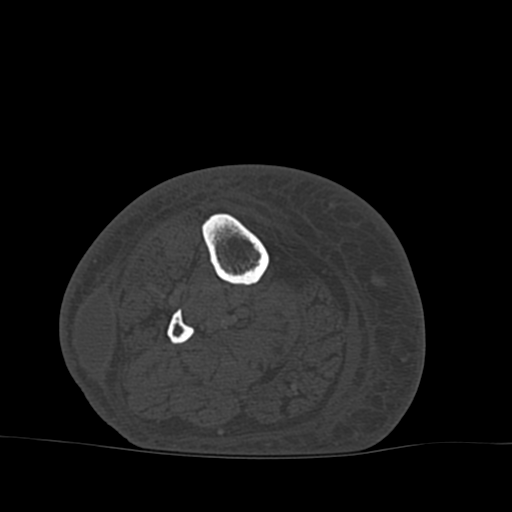
[im 185/219  bone]
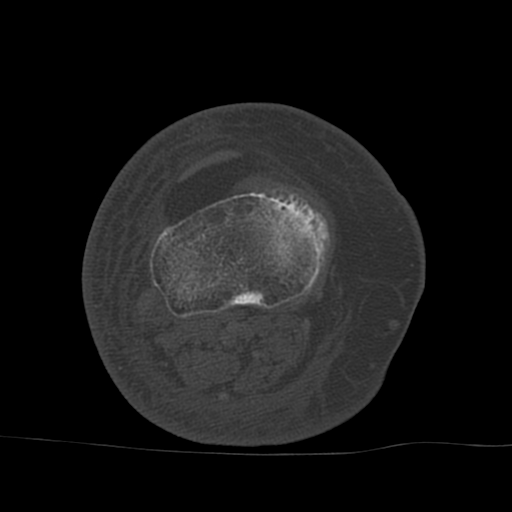
[im 202/219  soft-tissue]
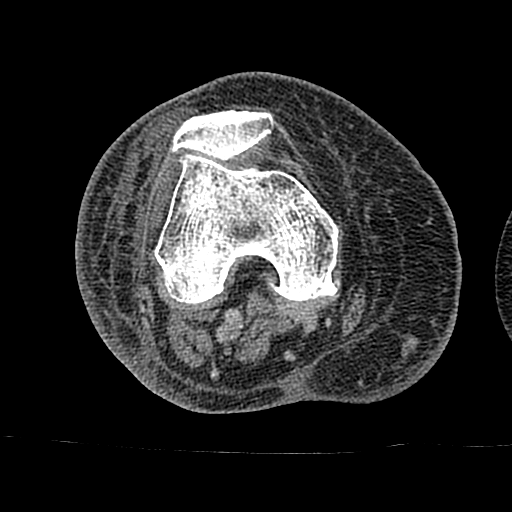
[im 202/219  bone]
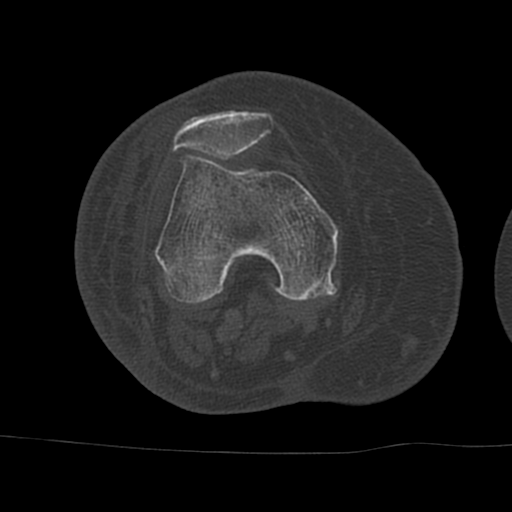

[Series 8: cor st · coronal · 0.40mm/px · 3 of 69 slices shown]
[im 14/69  bone]
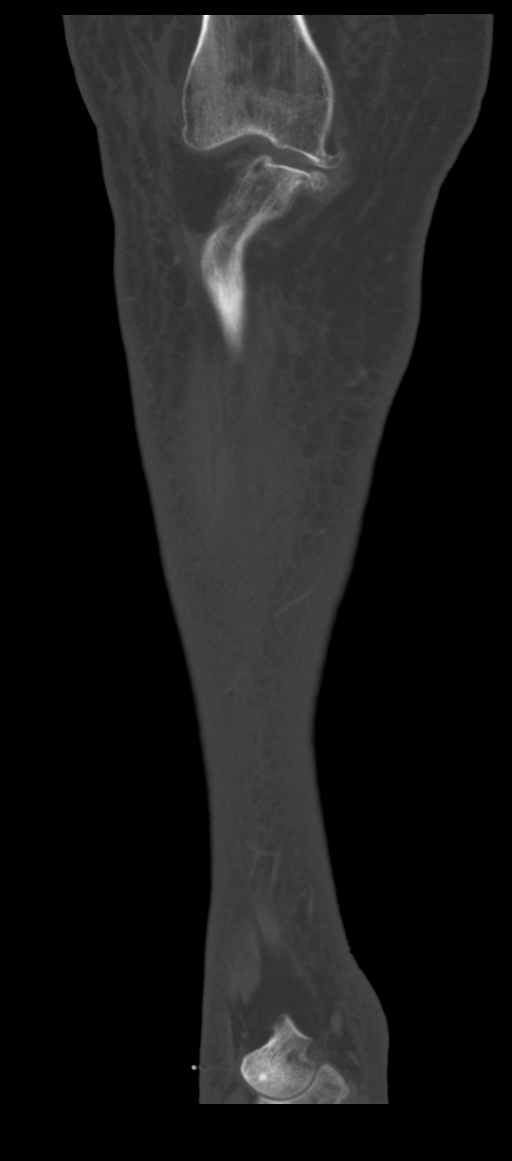
[im 28/69  bone]
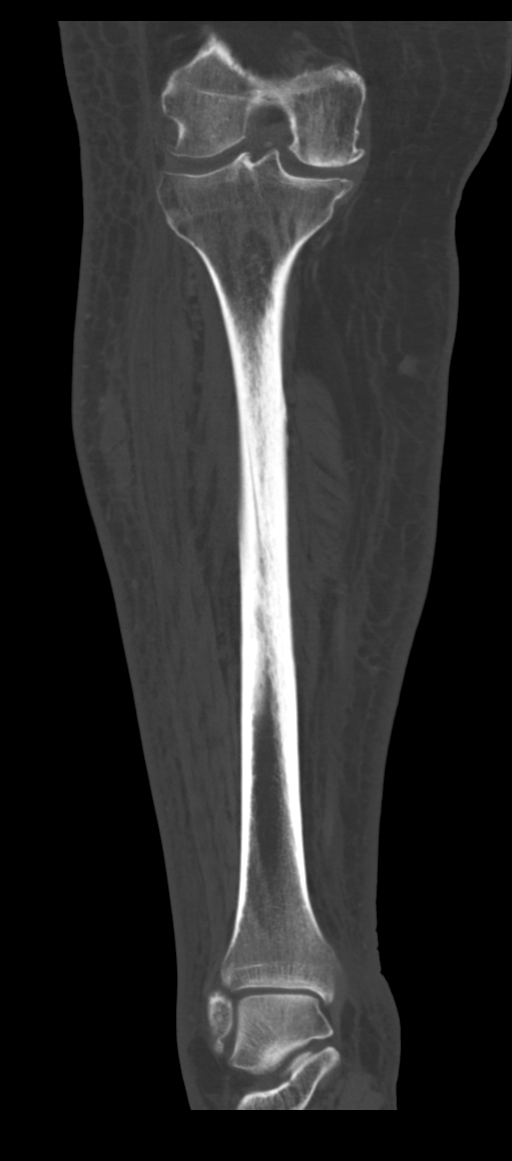
[im 41/69  bone]
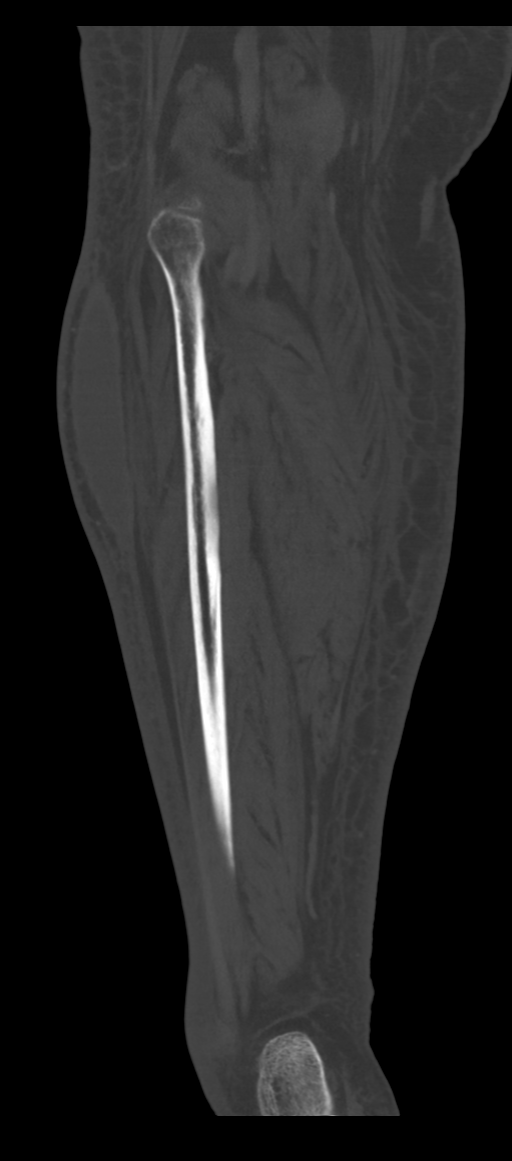

[12 of 33 positions shown; findings below may reference images not displayed]

FINDINGS: Bones/Joint/Cartilage

No evidence of acute fracture, dislocation or bone destruction.
There are tricompartmental degenerative changes at the knee, most
advanced in the medial compartment. There are probable small
intra-articular loose bodies. No significant knee joint effusion.
Mild degenerative changes are present at the ankle with subchondral
cyst formation medially in the talar dome.

Ligaments

Suboptimally assessed by CT. No gross abnormalities.

Muscles and Tendons

Unremarkable. The extensor mechanism appears intact at the knee. The
Achilles tendon is intact.

Soft tissues

There is a peripherally enhancing lenticular shaped fluid collection
laterally in the proximal aspect of the lower leg. This measures up
to 11.1 cm in length and up to 4.3 x 2.3 cm transverse. This
collection lies superficial to the anterior and lateral compartment
musculature and is located within the subcutaneous fat. There is
diffuse edema throughout the subcutaneous fat with mild dermal
thickening. No foreign body or soft tissue emphysema. No other focal
fluid collections are seen.
IMPRESSION: 1. Peripherally enhancing lenticular shaped fluid collection
laterally in the proximal aspect of the lower leg, consistent with
an abscess in the appropriate clinical context. This could reflect a
subacute hematoma or complex seroma.
2. No evidence of osteomyelitis or septic joint.
3. Tricompartmental degenerative changes at the knee, most advanced
in the medial compartment, with probable small intra-articular loose
bodies. Subchondral cyst formation medially in the talar dome.

## 2022-10-28 ENCOUNTER — Encounter (HOSPITAL_COMMUNITY): Payer: Self-pay | Admitting: Internal Medicine

## 2022-10-28 ENCOUNTER — Ambulatory Visit (HOSPITAL_COMMUNITY)
Admission: RE | Admit: 2022-10-28 | Discharge: 2022-10-28 | Disposition: A | Discharge status: Home / Self Care | Payer: Self-pay | Source: Ambulatory Visit | Attending: Internal Medicine | Admitting: Internal Medicine

## 2022-10-28 VITALS — BP 120/80 | HR 67 | Wt 231.2 lb

## 2022-10-28 DIAGNOSIS — R600 Localized edema: Secondary | ICD-10-CM | POA: Insufficient documentation

## 2022-10-28 DIAGNOSIS — E785 Hyperlipidemia, unspecified: Secondary | ICD-10-CM | POA: Insufficient documentation

## 2022-10-28 DIAGNOSIS — I158 Other secondary hypertension: Secondary | ICD-10-CM

## 2022-10-28 DIAGNOSIS — R0683 Snoring: Secondary | ICD-10-CM | POA: Insufficient documentation

## 2022-10-28 DIAGNOSIS — Z79899 Other long term (current) drug therapy: Secondary | ICD-10-CM | POA: Insufficient documentation

## 2022-10-28 DIAGNOSIS — I10 Essential (primary) hypertension: Secondary | ICD-10-CM | POA: Insufficient documentation

## 2022-10-28 DIAGNOSIS — Z79890 Hormone replacement therapy (postmenopausal): Secondary | ICD-10-CM | POA: Insufficient documentation

## 2022-10-28 DIAGNOSIS — R0602 Shortness of breath: Secondary | ICD-10-CM | POA: Insufficient documentation

## 2022-10-28 DIAGNOSIS — I5022 Chronic systolic (congestive) heart failure: Secondary | ICD-10-CM | POA: Insufficient documentation

## 2022-10-28 DIAGNOSIS — Z6839 Body mass index (BMI) 39.0-39.9, adult: Secondary | ICD-10-CM | POA: Insufficient documentation

## 2022-10-28 DIAGNOSIS — I272 Pulmonary hypertension, unspecified: Secondary | ICD-10-CM | POA: Insufficient documentation

## 2022-10-28 LAB — COMPREHENSIVE METABOLIC PANEL
ALT: 27 U/L (ref 0–44)
AST: 28 U/L (ref 15–41)
Albumin: 3.8 g/dL (ref 3.5–5.0)
Alkaline Phosphatase: 299 U/L — ABNORMAL HIGH (ref 38–126)
Anion gap: 11 (ref 5–15)
BUN: 24 mg/dL — ABNORMAL HIGH (ref 6–20)
CO2: 24 mmol/L (ref 22–32)
Calcium: 9.4 mg/dL (ref 8.9–10.3)
Chloride: 106 mmol/L (ref 98–111)
Creatinine, Ser: 1.09 mg/dL — ABNORMAL HIGH (ref 0.44–1.00)
GFR, Estimated: 60 mL/min — ABNORMAL LOW (ref >60)
Glucose, Bld: 107 mg/dL — ABNORMAL HIGH (ref 70–99)
Potassium: 4.3 mmol/L (ref 3.5–5.1)
Sodium: 141 mmol/L (ref 135–145)
Total Bilirubin: 2.3 mg/dL — ABNORMAL HIGH (ref 0.3–1.2)
Total Protein: 7.1 g/dL (ref 6.5–8.1)

## 2022-10-28 MED ORDER — CARVEDILOL 6.25 MG PO TABS: 12.5000 mg | ORAL_TABLET | Freq: Two times a day (BID) | ORAL | 3 refills | Status: DC

## 2022-10-28 MED ORDER — SPIRONOLACTONE 25 MG PO TABS: 12.5000 mg | ORAL_TABLET | Freq: Every day | ORAL | 3 refills | Status: DC

## 2022-10-28 NOTE — Patient Instructions (Addendum)
DECREASE Carvedilol to 6.25mg  Twice daily  START Spironolactone 12.5 mg ( 1/2 Tab) daily.  Labs done today, your results will be available in MyChart, we will contact you for abnormal readings.  Your provider has ordered a scan of the vessels in your lungs. You will be called to have this test arranged.  Your provider has ordered a lung function test for you. You will be called to have this test arranged.  You have been referred to the pulmonologist.  They will call you to arrange your appointment.  Your physician recommends that you schedule a follow-up appointment in: 3 weeks  If you have any questions or concerns before your next appointment please send [place] a message through [place] or call our office at [phone].    TO LEAVE A MESSAGE FOR THE NURSE SELECT OPTION 2, PLEASE LEAVE A MESSAGE INCLUDING: YOUR NAME DATE OF BIRTH CALL BACK NUMBER REASON FOR CALL**this is important as we prioritize the call backs  YOU WILL RECEIVE A CALL BACK THE SAME DAY AS LONG AS YOU CALL BEFORE 4:00 PM  At the [facility], you and your health needs are our priority. As part of our continuing mission to provide you with exceptional heart care, we have created designated Provider Care Teams. These Care Teams include your primary Cardiologist (physician) and Advanced Practice Providers (APPs- Physician Assistants and Nurse Practitioners) who all work together to provide you with the care you need, when you need it.   You may see any of the following providers on your designated Care Team at your next follow up: Dr Arvilla Meres Dr Marca Ancona Dr. Marcos Eke, NP Robbie Lis, [place] [facility] [place], [place] Brynda Peon, NP Karle Plumber, PharmD   Please be sure to bring in all your medications bottles to every appointment.    Thank you for choosing [facility]

## 2022-10-28 NOTE — Progress Notes (Addendum)
ADVANCED HF CLINIC CONSULT NOTE  Referring Physician: Eartha Inch, MD Primary Care: Eartha Inch, MD Primary Cardiologist: None  HPI:  Jaclyn Klein is a 57 y/o woman with morbid obesity, HTN referred by Dr. Fransisco Beau for further evaluation of pulmonary HTN.  Denies h/o known cardiac disease prior to recent diagnosis  Has been overweight for a long time. Max weight 307.  In 2021 lost 97 pounds with diet. Regained 20 pounds. Was on Phen-Phen for several years. Has been told she snores heavily. Not tested for OSA.   Began to feel SOB in 12/23. Got progressively worse to where she was SOB with any activity. + LE edema.    Echo 2/24 EF 45% RV mod reduced Mod TR. Then underwent L/R HC om 09/14/22 at [facility]  No significant CAD. RA 20 RV 94/23 PA 98/38 (59)  PCWP 32 (LVEDP 24)  AO sat 91% PA sat 60% Fick 4.3/2.1  PVR 6.3 PaPi 3.55  Seen by Dr. Sharmon Revere at [facility] PHA/Pulmonary clinic. 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 Concern for OSA and cirrhosis.   Non smoker. No family h/o scleroderma, lupus, RA. No h/o DVT. No FHx of PAH. Now taking lasix 40 daily. Marked improvement in functional capacity. No syncope or presyncope. Can do all ADLs without problem.  Review of Systems: [y] = yes,  = no   General: Weight gain [ y]; Weight loss [ y]; Anorexia ; Fatigue [ y]; Fever ; Chills ; Weakness   Cardiac: Chest pain/pressure ; Resting SOB ; Exertional SOB ; Orthopnea ; Pedal Edema ; Palpitations ; Syncope ; Presyncope ; Paroxysmal nocturnal dyspnea[ ]   Pulmonary: Cough ; Wheezing[ ] ; Hemoptysis[ ] ; Sputum ; Snoring Cove.[place]ienne ]  GI: Vomiting[ ] ; Dysphagia[ ] ; Melena[ ] ; Hematochezia ; Heartburn[ ] ; Abdominal pain ; Constipation ; Diarrhea ; BRBPR   GU: Hematuria[ ] ; Dysuria ; Nocturia[ ]   Vascular: Pain in legs with walking ; Pain in feet with lying flat ; Non-healing sores ; Stroke ; TIA ; Slurred speech ;  Neuro:  Headaches[ ] ; Vertigo[ ] ; Seizures[ ] ; Paresthesias[ ] ;Blurred vision ; Diplopia ; Vision changes   Ortho/Skin: Arthritis Cove.[place]ienne ]; Joint pain [ y]; Muscle pain ; Joint swelling ; Back Pain ; Rash   Psych: Depression[ ] ; Anxiety[ ]   Heme: Bleeding problems ; Clotting disorders ; Anemia   Endocrine: Diabetes ; Thyroid dysfunction[ ]    Past Medical History:  Diagnosis Date   Hyperlipidemia    Hypertension     Current Outpatient Medications  Medication Sig Dispense Refill   acetaminophen (TYLENOL) 325 MG tablet Take 2 tablets (650 mg total) by mouth every 6 (six) hours as needed for mild pain (or Fever >/= 101). 12 tablet 0   ALPRAZolam (XANAX) 0.5 MG tablet Take 0.5 mg by mouth as needed for anxiety.     atorvastatin (LIPITOR) 80 MG tablet Take 1 tablet by mouth daily.     carvedilol (COREG) 12.5 MG tablet Take 12.5 mg by mouth 2 (two) times daily with a meal.     esomeprazole (NEXIUM) 40 MG capsule Take 40 mg by mouth daily at 12 noon.     furosemide (LASIX) 40 MG tablet Take 40 mg by mouth daily.     levothyroxine (SYNTHROID) 100 MCG tablet Take 1  tablet by mouth daily.     lisinopril (ZESTRIL) 20 MG tablet Take 20 mg by mouth daily.     naproxen sodium (ANAPROX) 220 MG tablet Take 220 mg by mouth 2 (two) times daily with a meal.     No current facility-administered medications for this encounter.    No Known Allergies    Social History   Socioeconomic History   Marital status: Married    Spouse name: Not on file   Number of children: Not on file   Years of education: Not on file   Highest education level: Not on file  Occupational History   Not on file  Tobacco Use   Smoking status: Never   Smokeless tobacco: Never  Vaping Use   Vaping Use: Never used  Substance and Sexual Activity   Alcohol use: Yes    Alcohol/week: 14.0 standard drinks of alcohol    Types: 14 Glasses of wine per week    Comment: 2 glasses to bottle a day   Drug use:  Never   Sexual activity: Yes  Other Topics Concern   Not on file  Social History Narrative   Not on file   Social Determinants of Health   Financial Resource Strain: Not on file  Food Insecurity: Not on file  Transportation Needs: Not on file  Physical Activity: Not on file  Stress: Not on file  Social Connections: Not on file  Intimate Partner Violence: Not on file      Family History  Problem Relation Age of Onset   Healthy Mother    Healthy Father     Vitals:   10/28/22 1416  BP: 120/80  Pulse: 67  SpO2: 95%  Weight: 104.9 kg (231 lb 3.2 oz)    PHYSICAL EXAM: General:  Obese woman No respiratory difficulty HEENT: normal Neck: supple. no JVD. Carotids 2+ bilat; no bruits. No lymphadenopathy or thryomegaly appreciated. Cor: PMI nondisplaced. Regular rate & rhythm. No rubs, gallops or murmurs. Lungs: clear Abdomen: obese soft, nontender, nondistended. No hepatosplenomegaly. No bruits or masses. Good bowel sounds. Extremities: no cyanosis, clubbing, rash, edema Neuro: alert & oriented x 3, cranial nerves grossly intact. moves all 4 extremities w/o difficulty. Affect pleasant.  ECG: NSR 64 1AVB RAD RVH Personally reviewed   ASSESSMENT & PLAN:  1. Pulmonary HTN - Echo 2/24 EF 45% RV mod reduced Mod TR.  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 Low suspicion for WHO Group I at this point.  - Symptoms much improved with diuresis. Now NYHA I-II - Hall walk with sats > 90% (95%-> 92%) - For completeness sake will check auto-immune serology and VQ scan - Will need PFTs with DLCO and refer to Pulmonary for sleep study (unable to do HST without insurance) - Will need to work on weight loss.  - Consider repeat RHC when optimized  2. Chronic HF with mildly reduced EF - RHC and echo as above.  - Suspect LVEF appears down due to RV  strain - symptoms much improved with diuresis.  - Continue lisinopril - Add spiro 12.5 - Decrease carvedilol to 6.25 bid.  - Will need SGLT2i at next visit and repeat echo  3. Morbid obesity - Body mass index is 39.69 kg/m. - Consider GLP-1RA  4. Snoring  - refer fr sleep study as above.   Reuel Boom  Jaclyn Guglielmo, MD  3:00 PM

## 2022-10-28 NOTE — Progress Notes (Signed)
Patient ambulated in hall  oxygen saturation started at 95% and  the lowest was 92%

## 2022-10-29 LAB — ANA W/REFLEX: Anti Nuclear Antibody (ANA): NEGATIVE

## 2022-10-29 LAB — ANTI-SCLERODERMA ANTIBODY: Scleroderma (Scl-70) (ENA) Antibody, IgG: <0.2 AI (ref 0.0–0.9)

## 2022-10-29 LAB — ANCA TITERS
Atypical P-ANCA titer: <1:20 {titer}
C-ANCA: <1:20 {titer}
P-ANCA: <1:20 {titer}

## 2022-10-30 LAB — RHEUMATOID FACTOR: Rheumatoid fact SerPl-aCnc: <10 IU/mL (ref <14.0)

## 2022-11-22 NOTE — Progress Notes (Unsigned)
ADVANCED HF CLINIC  NOTE  Referring Physician: Eartha Inch, MD Primary Care: Eartha Inch, MD Primary Cardiologist: None  HPI:  Jaclyn Klein is a 57 y/o woman with morbid obesity, HTN referred by Dr. Fransisco Beau for further evaluation of pulmonary HTN.  I saw her in 4/24 for the first visit. Denied h/o known cardiac disease prior to recent diagnosis  Has been overweight for a long time. Max weight 307.  In 2021 lost 97 pounds with diet. Regained 20 pounds. Was on Phen-Phen for several years. Has been told she snores heavily. Not tested for OSA.   Began to feel SOB in 12/23. Got progressively worse to where she was SOB with any activity. + LE edema.    Echo 2/24 EF 45% RV mod reduced Mod TR. Then underwent L/R HC om 09/14/22 at [facility]  No significant CAD. RA 20 RV 94/23 PA 98/38 (59)  PCWP 32 (LVEDP 24)  AO sat 91% PA sat 60% Fick 4.3/2.1  PVR 6.3 PaPi 3.55  At last visit we ordered  VQ, PFTs, sleep study and PAH serologies. Serologies negative. All other tests still pending   Here for f/u with her daughter. Feels great. Has lost 10 pounds. Says she is back to normal after getting fluid off. Fluid resolved. No more edema, orthopnea or PND.     Past Medical History:  Diagnosis Date   Hyperlipidemia    Hypertension     Current Outpatient Medications  Medication Sig Dispense Refill   acetaminophen (TYLENOL) 325 MG tablet Take 2 tablets (650 mg total) by mouth every 6 (six) hours as needed for mild pain (or Fever >/= 101). 12 tablet 0   ALPRAZolam (XANAX) 0.5 MG tablet Take 0.5 mg by mouth as needed for anxiety.     atorvastatin (LIPITOR) 80 MG tablet Take 1 tablet by mouth daily.     carvedilol (COREG) 6.25 MG tablet Take 2 tablets (12.5 mg total) by mouth 2 (two) times daily with a meal. 180 tablet 3   esomeprazole (NEXIUM) 40 MG capsule Take 40 mg by mouth daily at 12 noon.     furosemide (LASIX) 40 MG tablet Take 40 mg by mouth daily.     levothyroxine (SYNTHROID) 100  MCG tablet Take 1 tablet by mouth daily.     lisinopril (ZESTRIL) 20 MG tablet Take 20 mg by mouth daily.     spironolactone (ALDACTONE) 25 MG tablet Take 0.5 tablets (12.5 mg total) by mouth daily. 45 tablet 3   naproxen sodium (ANAPROX) 220 MG tablet Take 220 mg by mouth 2 (two) times daily with a meal. (Patient not taking: Reported on 11/24/2022)     No current facility-administered medications for this visit.    No Known Allergies    Social History   Socioeconomic History   Marital status: Married    Spouse name: Not on file   Number of children: Not on file   Years of education: Not on file   Highest education level: Not on file  Occupational History   Not on file  Tobacco Use   Smoking status: Never   Smokeless tobacco: Never  Vaping Use   Vaping Use: Never used  Substance and Sexual Activity   Alcohol use: Yes    Alcohol/week: 14.0 standard drinks of alcohol    Types: 14 Glasses of wine per week    Comment: 2 glasses to bottle a day   Drug use: Never   Sexual activity: Yes  Other Topics Concern  Not on file  Social History Narrative   Not on file   Social Determinants of Health   Financial Resource Strain: Not on file  Food Insecurity: Not on file  Transportation Needs: Not on file  Physical Activity: Not on file  Stress: Not on file  Social Connections: Not on file  Intimate Partner Violence: Not on file      Family History  Problem Relation Age of Onset   Healthy Mother    Healthy Father     Vitals:   11/24/22 1024  BP: 118/78  Pulse: 62  SpO2: 96%  Weight: 222 lb 3.2 oz (100.8 kg)    Wt Readings from Last 3 Encounters:  11/24/22 222 lb 3.2 oz (100.8 kg)  10/28/22 231 lb 3.2 oz (104.9 kg)  06/30/19 (!) 319 lb 1.6 oz (144.7 kg)    PHYSICAL EXAM: General:  Obese woman No respiratory difficulty HEENT: normal Neck: supple. no JVD. Carotids 2+ bilat; no bruits. No lymphadenopathy or thryomegaly appreciated. Cor: PMI nondisplaced. Regular  rate & rhythm. No rubs, gallops or murmurs. Lungs: clear. Mildly decreased  Abdomen: obese soft, nontender, nondistended. No hepatosplenomegaly. No bruits or masses. Good bowel sounds. Extremities: no cyanosis, clubbing, rash, trace edema Neuro: alert & orientedx3, cranial nerves grossly intact. moves all 4 extremities w/o difficulty. Affect pleasant  ASSESSMENT & PLAN:  1. Pulmonary HTN - Echo 2/24 EF 45% RV mod reduced Mod TR.  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 Low suspicion for WHO Group I at this point.  - Symptoms much improved with diuresis. Now NYHA I-II - Hall walk with sats > 90% (95%-> 92%) - For completeness sake will check auto-immune serology (negative) and VQ scan - Has referral to Pulmonary for  PFTs with DLCO and sleep study (unable to do HST without insurance) - Will hold off on VQ for now due to lack of insurance (low suspicion for CTEPH) - Will need to work on weight loss.  - Consider repeat RHC when optimized  2. Chronic HF with mildly reduced EF - RHC and echo as above.  - Suspect LVEF appears down due to RV strain - symptoms much improved with diuresis.  - Continue lisinopril - Continue spiro 12.5 - Continue carvedilol to 6.25 bid.  - Start Jardiance 10 (if we can get it without insurance) - will ask PharmD for assistance - If can get [place] Pepper will switch lasix 40 to prn - Repeat echo in 3-6 months  3. Morbid obesity - Body mass index is 38.14 kg/m. - Consider GLP-1RA - Continue weight loss efforts  4. Snoring  - refer for sleep study as above.   Arvilla Meres, MD  10:51 AM

## 2022-11-24 ENCOUNTER — Ambulatory Visit (HOSPITAL_BASED_OUTPATIENT_CLINIC_OR_DEPARTMENT_OTHER): Payer: Self-pay | Admitting: Internal Medicine

## 2022-11-24 ENCOUNTER — Other Ambulatory Visit: Payer: Self-pay

## 2022-11-24 ENCOUNTER — Other Ambulatory Visit
Admission: RE | Admit: 2022-11-24 | Discharge: 2022-11-24 | Disposition: A | Discharge status: Home / Self Care | Payer: Self-pay | Source: Ambulatory Visit | Attending: Internal Medicine | Admitting: Internal Medicine

## 2022-11-24 ENCOUNTER — Encounter: Payer: Self-pay | Admitting: Internal Medicine

## 2022-11-24 VITALS — BP 118/78 | HR 62 | Wt 222.2 lb

## 2022-11-24 DIAGNOSIS — I272 Pulmonary hypertension, unspecified: Secondary | ICD-10-CM

## 2022-11-24 DIAGNOSIS — I5022 Chronic systolic (congestive) heart failure: Secondary | ICD-10-CM | POA: Insufficient documentation

## 2022-11-24 LAB — BASIC METABOLIC PANEL
Anion gap: 9 (ref 5–15)
BUN: 52 mg/dL — ABNORMAL HIGH (ref 6–20)
CO2: 28 mmol/L (ref 22–32)
Calcium: 9.7 mg/dL (ref 8.9–10.3)
Chloride: 101 mmol/L (ref 98–111)
Creatinine, Ser: 1.33 mg/dL — ABNORMAL HIGH (ref 0.44–1.00)
GFR, Estimated: 47 mL/min — ABNORMAL LOW (ref >60)
Glucose, Bld: 116 mg/dL — ABNORMAL HIGH (ref 70–99)
Potassium: 4.2 mmol/L (ref 3.5–5.1)
Sodium: 138 mmol/L (ref 135–145)

## 2022-11-24 MED ORDER — DAPAGLIFLOZIN PROPANEDIOL 10 MG PO TABS
10.0000 mg | ORAL_TABLET | Freq: Every day | ORAL | 6 refills | Status: DC
  Filled 2022-11-24: qty 30, 30d supply, fill #0

## 2022-11-24 MED ORDER — FUROSEMIDE 40 MG PO TABS
40.0000 mg | ORAL_TABLET | Freq: Every day | ORAL | 3 refills | Status: DC
  Filled 2022-11-24: qty 30, 30d supply, fill #0

## 2022-11-24 NOTE — Addendum Note (Signed)
Addended by: Electa Sniff on: 11/24/2022 11:24 AM   Modules accepted: Orders

## 2022-11-24 NOTE — Patient Instructions (Addendum)
Medication Changes:  Start Farxiga 10 mg (1 tablet) daily.  Take Lasix 40 mg (1 tablet) ONLY as Needed.  Lab Work:  Labs done today, your results will be available in MyChart, we will contact you for abnormal readings.  Special Instructions // Education:  Do the following things EVERYDAY: Weigh yourself in the morning before breakfast. Write it down and keep it in a log. Take your medicines as prescribed Eat low salt foods--Limit salt (sodium) to 2000 mg per day.  Stay as active as you can everyday Limit all fluids for the day to less than 2 liters   Follow-Up in: follow up in 3 month with Dr. Gala Romney.     If you have any questions or concerns before your next appointment please send [place] a message through [place] or call our office at [phone] Monday-Friday 8 am-5 pm.   If you have an urgent need after hours on the weekend please call your Primary Cardiologist or the [facility] in [place] at [phone].

## 2022-11-25 ENCOUNTER — Encounter: Payer: Self-pay | Admitting: Internal Medicine

## 2022-11-25 ENCOUNTER — Telehealth: Payer: Self-pay

## 2022-11-25 NOTE — Telephone Encounter (Signed)
Pt aware, agreeable, and verbalized understanding. Pt requests that she get labs drawn at her PCP in [place].  This nurse confirmed with pt's PCP office that they are willing to draw BMET and will fax results to [facility] and they will draw labs in 2 weeks.  BMET order faxed to [facility].

## 2022-11-25 NOTE — Telephone Encounter (Signed)
-----   Message from Dolores Patty, MD sent at 11/24/2022  5:56 PM EDT ----- Looks a bit dehyrdrated from lasix. Lasix stopped today and jardiance added. Please repeat BMET in 2 weeks

## 2022-11-26 ENCOUNTER — Other Ambulatory Visit: Payer: Self-pay

## 2022-11-30 ENCOUNTER — Encounter: Payer: Self-pay | Admitting: Pulmonary Disease

## 2022-11-30 ENCOUNTER — Ambulatory Visit (INDEPENDENT_AMBULATORY_CARE_PROVIDER_SITE_OTHER): Payer: Self-pay | Admitting: Pulmonary Disease

## 2022-11-30 VITALS — BP 142/78 | HR 64 | Temp 98.0°F | Ht 64.0 in | Wt 221.0 lb

## 2022-11-30 DIAGNOSIS — I272 Pulmonary hypertension, unspecified: Secondary | ICD-10-CM

## 2022-11-30 NOTE — Patient Instructions (Signed)
Nice to see you  I agree with everything you are doing, continue to watch the salt, watch the water intake.  Check your weights and take Lasix as prescribed.  The main goal for treat your pulmonary hypertension is keeping fluid off and seeing if we can help the heart remodeling get stronger on the left side.  To further round up your workup, I recommend a high-resolution CT scan of your chest, pulmonary function test, and a sleep test.  I think the thing that we will have the biggest impact on your health overall would be the sleep test and see if sleep apnea is present, and if so treating with CPAP.  I will look into options for sleep tests etc., see me a message in the next week or 2 and let me know if he would like to move forward with this at this time or at a later date.  Based on timing of sleep test, I will arrange follow-up, otherwise continue to follow with Dr. Gala Romney and I am happy to see you anytime as needed for the pulmonary hypertension or other issues with your breathing

## 2022-11-30 NOTE — Progress Notes (Unsigned)
@Patient  ID: Jaclyn Klein, female    DOB: 07/19/1965, 57 y.o.   MRN: [id number]  Chief Complaint  Patient presents with   Consult    PHTN     Referring provider: Dolores Patty, MD  HPI:   PMH:  Smoker/ Smoking History:  Maintenance:   Pt of:   11/30/2022  - Visit     Questionaires / Pulmonary Flowsheets:   ACT:      No data to display          MMRC:     No data to display          Epworth:      No data to display          Tests:   FENO:  No results found for: "NITRICOXIDE"  PFT:     No data to display          WALK:      No data to display          Imaging: No results found.  Lab Results:  CBC    Component Value Date/Time   WBC 9.9 07/01/2019 0640   RBC 4.24 07/01/2019 0640   HGB 13.9 07/01/2019 0640   HCT 42.0 07/01/2019 0640   PLT 171 07/01/2019 0640   MCV 99.1 07/01/2019 0640   MCH 32.8 07/01/2019 0640   MCHC 33.1 07/01/2019 0640   RDW 14.3 07/01/2019 0640   LYMPHSABS 1.6 06/30/2019 1343   MONOABS 0.7 06/30/2019 1343   EOSABS 0.1 06/30/2019 1343   BASOSABS 0.1 06/30/2019 1343    BMET    Component Value Date/Time   NA 138 11/24/2022 1202   K 4.2 11/24/2022 1202   CL 101 11/24/2022 1202   CO2 28 11/24/2022 1202   GLUCOSE 116 (H) 11/24/2022 1202   BUN 52 (H) 11/24/2022 1202   CREATININE 1.33 (H) 11/24/2022 1202   CALCIUM 9.7 11/24/2022 1202   GFRNONAA 47 (L) 11/24/2022 1202   GFRAA >60 07/03/2019 0437    BNP No results found for: "BNP"  ProBNP No results found for: "PROBNP"  Specialty Problems   None   No Known Allergies   There is no immunization history on file for this patient.  Past Medical History:  Diagnosis Date   Hyperlipidemia    Hypertension     Tobacco History: Social History   Tobacco Use  Smoking Status Never  Smokeless Tobacco Never   Counseling given: Not Answered   Continue to not smoke  Outpatient Encounter Medications as of 11/30/2022  Medication  Sig   acetaminophen (TYLENOL) 325 MG tablet Take 2 tablets (650 mg total) by mouth every 6 (six) hours as needed for mild pain (or Fever >/= 101).   ALPRAZolam (XANAX) 0.5 MG tablet Take 0.5 mg by mouth as needed for anxiety.   atorvastatin (LIPITOR) 80 MG tablet Take 1 tablet by mouth daily.   carvedilol (COREG) 6.25 MG tablet Take 2 tablets (12.5 mg total) by mouth 2 (two) times daily with a meal.   dapagliflozin propanediol (FARXIGA) 10 MG TABS tablet Take 1 tablet (10 mg total) by mouth daily before breakfast.   esomeprazole (NEXIUM) 40 MG capsule Take 40 mg by mouth daily at 12 noon.   furosemide (LASIX) 40 MG tablet Take 1 tablet (40 mg total) by mouth daily.   levothyroxine (SYNTHROID) 100 MCG tablet Take 1 tablet by mouth daily.   lisinopril (ZESTRIL) 20 MG tablet Take 20 mg by mouth daily.   naproxen  sodium (ANAPROX) 220 MG tablet Take 220 mg by mouth 2 (two) times daily with a meal.   spironolactone (ALDACTONE) 25 MG tablet Take 0.5 tablets (12.5 mg total) by mouth daily.   No facility-administered encounter medications on file as of 11/30/2022.     Review of Systems  Review of Systems   Physical Exam  BP (!) 142/78 (BP Location: Right Arm, Patient Position: Sitting, Cuff Size: Large)   Pulse 64   Temp 98 F (36.7 C) (Oral)   Ht 5\' 4"  (1.626 m)   Wt 221 lb (100.2 kg)   LMP  (LMP Unknown) Comment: takes birth control, hcg 4 on 08/20/2015  SpO2 99%   BMI 37.93 kg/m   Wt Readings from Last 5 Encounters:  11/30/22 221 lb (100.2 kg)  11/24/22 222 lb 3.2 oz (100.8 kg)  10/28/22 231 lb 3.2 oz (104.9 kg)  06/30/19 (!) 319 lb 1.6 oz (144.7 kg)  06/23/19 300 lb (136.1 kg)    BMI Readings from Last 5 Encounters:  11/30/22 37.93 kg/m  11/24/22 38.14 kg/m  10/28/22 39.69 kg/m  06/30/19 54.77 kg/m  06/23/19 51.49 kg/m     Physical Exam    Assessment & Plan:   Pulmonary hypertension: Suspect largely driven by group 2 disease given elevated LVEDP of 24, reduced  ejection fraction etc.  PVR is elevated which begs a question of either chronic changes related to group 2 or concomitant Group 1 disease.  Given large group 2 disease, do not see a role for pulmonary vasodilators currently.  Agree with salt control, diuretics, medicines for reduced EF to remodel and improve systolic function.  She has received excellent care from cardiology colleagues.  Symptomatically she is improved.  Discussed role and rationale for additional testing to further evaluate contributing causes to pulmonary hypertension and help [place] treat her best now in the future.  As such recommend high-res CT scan, pulmonary function test, sleep test/polysomnography, eventually VQ scan although she denies any history of pulmonary embolism.  I suspect the most impactful of these test would be a sleep test/polysomnography and if sleep apnea is present treatment of such with CPAP would improve stress on the heart, pulmonary hypertension therapy.  This was shared in detail today.  Unfortunately her lack of insurance hinders her ability to get everything done.  I recommended at least moving forward with sleep test sooner rather than later but certainly can defer based on financial strain.   Return if symptoms worsen or fail to improve.   Karren Burly, MD 11/30/2022   This appointment required *** minutes of patient care (this includes precharting, chart review, review of results, face-to-face care, etc.).

## 2023-02-09 ENCOUNTER — Telehealth: Payer: Self-pay

## 2023-02-09 NOTE — Telephone Encounter (Signed)
Pt called requesting samples for Farxiga. Pt states she will turn in her grant paperwork as well for Marcelline Deist and pick up the samples on Thursday 8/1 at her next appt.

## 2023-03-08 ENCOUNTER — Other Ambulatory Visit
Admission: RE | Admit: 2023-03-08 | Discharge: 2023-03-08 | Disposition: A | Discharge status: Home / Self Care | Payer: Self-pay | Source: Ambulatory Visit | Attending: Internal Medicine | Admitting: Internal Medicine

## 2023-03-08 ENCOUNTER — Encounter: Payer: Self-pay | Admitting: Internal Medicine

## 2023-03-08 ENCOUNTER — Other Ambulatory Visit: Payer: Self-pay

## 2023-03-08 ENCOUNTER — Ambulatory Visit (HOSPITAL_BASED_OUTPATIENT_CLINIC_OR_DEPARTMENT_OTHER): Payer: Self-pay | Admitting: Internal Medicine

## 2023-03-08 VITALS — BP 109/79 | HR 66 | Resp 14 | Wt 216.1 lb

## 2023-03-08 DIAGNOSIS — Z6837 Body mass index (BMI) 37.0-37.9, adult: Secondary | ICD-10-CM | POA: Insufficient documentation

## 2023-03-08 DIAGNOSIS — I272 Pulmonary hypertension, unspecified: Secondary | ICD-10-CM | POA: Insufficient documentation

## 2023-03-08 DIAGNOSIS — I5022 Chronic systolic (congestive) heart failure: Secondary | ICD-10-CM | POA: Insufficient documentation

## 2023-03-08 DIAGNOSIS — I110 Hypertensive heart disease with heart failure: Secondary | ICD-10-CM | POA: Insufficient documentation

## 2023-03-08 DIAGNOSIS — R609 Edema, unspecified: Secondary | ICD-10-CM | POA: Insufficient documentation

## 2023-03-08 DIAGNOSIS — Z7984 Long term (current) use of oral hypoglycemic drugs: Secondary | ICD-10-CM | POA: Insufficient documentation

## 2023-03-08 DIAGNOSIS — R0683 Snoring: Secondary | ICD-10-CM | POA: Insufficient documentation

## 2023-03-08 DIAGNOSIS — R0602 Shortness of breath: Secondary | ICD-10-CM | POA: Insufficient documentation

## 2023-03-08 DIAGNOSIS — Z79899 Other long term (current) drug therapy: Secondary | ICD-10-CM | POA: Insufficient documentation

## 2023-03-08 LAB — BASIC METABOLIC PANEL
Anion gap: 9 (ref 5–15)
BUN: 51 mg/dL — ABNORMAL HIGH (ref 6–20)
CO2: 25 mmol/L (ref 22–32)
Calcium: 9.5 mg/dL (ref 8.9–10.3)
Chloride: 103 mmol/L (ref 98–111)
Creatinine, Ser: 1.33 mg/dL — ABNORMAL HIGH (ref 0.44–1.00)
GFR, Estimated: 47 mL/min — ABNORMAL LOW (ref >60)
Glucose, Bld: 100 mg/dL — ABNORMAL HIGH (ref 70–99)
Potassium: 4.5 mmol/L (ref 3.5–5.1)
Sodium: 137 mmol/L (ref 135–145)

## 2023-03-08 MED ORDER — LISINOPRIL 20 MG PO TABS: 20.0000 mg | ORAL_TABLET | Freq: Every day | ORAL | 3 refills | Status: DC

## 2023-03-08 MED ORDER — LISINOPRIL 20 MG PO TABS
20.0000 mg | ORAL_TABLET | Freq: Every day | ORAL | 3 refills | Status: DC
  Filled 2023-03-08: qty 20, 20d supply, fill #0
  Filled 2023-03-08: qty 70, 70d supply, fill #0

## 2023-03-08 NOTE — Patient Instructions (Signed)
Lab Work:  Labs done today, your results will be available in MyChart, we will contact you for abnormal readings.   Special Instructions // Education:  Do the following things EVERYDAY: Weigh yourself in the morning before breakfast. Write it down and keep it in a log. Take your medicines as prescribed Eat low salt foods--Limit salt (sodium) to 2000 mg per day.  Stay as active as you can everyday Limit all fluids for the day to less than 2 liters   Follow-Up in: follow up in 5 months. Please call our clinic in January to schedule this appointment.    If you have any questions or concerns before your next appointment please send [place] a message through [place] or call our office at [phone] Monday-Friday 8 am-5 pm.   If you have an urgent need after hours on the weekend please call your Primary Cardiologist or the [facility] in [place] at [phone].

## 2023-03-08 NOTE — Progress Notes (Signed)
ADVANCED HF CLINIC  NOTE  Referring Physician: Eartha Inch, MD Primary Care: Eartha Inch, MD Primary Cardiologist: None  HPI:  Jaclyn Klein is a 57 y/o woman with morbid obesity, HTN referred by Dr. Fransisco Beau for further evaluation of pulmonary HTN.  I saw her in 4/24 for the first visit. Denied h/o known cardiac disease prior to recent diagnosis  Has been overweight for a long time. Max weight 307.  In 2021 lost 97 pounds with diet. Regained 20 pounds. Was on Phen-Phen for several years. Has been told she snores heavily. Not tested for OSA.   Began to feel SOB in 12/23. Got progressively worse to where she was SOB with any activity. + LE edema.    Echo 2/24 EF 45% RV mod reduced Mod TR. Then underwent L/R HC om 09/14/22 at [facility]  No significant CAD. RA 20 RV 94/23 PA 98/38 (59)  PCWP 32 (LVEDP 24)  AO sat 91% PA sat 60% Fick 4.3/2.1  PVR 6.3 PaPi 3.55  At last visit we re-ordered  VQ, PFTs, sleep study and PAH serologies. Serologies negative. All other tests still pending.   Here for f/u with her daughter. Saw Dr. Judeth Horn in consult. He felt mostly WHO Group 2 and didn't feel PFTs would be overly helpful in this situation. Strongly suggested sleep study but limited by lack of insurance. She feels great. Has lost about 25 pounds. No edema, orthopnea or PND. Daughter says she snores heavily.    Past Medical History:  Diagnosis Date   Hyperlipidemia    Hypertension     Current Outpatient Medications  Medication Sig Dispense Refill   acetaminophen (TYLENOL) 325 MG tablet Take 2 tablets (650 mg total) by mouth every 6 (six) hours as needed for mild pain (or Fever >/= 101). 12 tablet 0   ALPRAZolam (XANAX) 0.5 MG tablet Take 0.5 mg by mouth as needed for anxiety.     atorvastatin (LIPITOR) 80 MG tablet Take 1 tablet by mouth daily.     carvedilol (COREG) 6.25 MG tablet Take 2 tablets (12.5 mg total) by mouth 2 (two) times daily with a meal. (Patient taking differently:  Take 6.25 mg by mouth 2 (two) times daily with a meal.) 180 tablet 3   dapagliflozin propanediol (FARXIGA) 10 MG TABS tablet Take 1 tablet (10 mg total) by mouth daily before breakfast. 30 tablet 6   diphenhydramine-acetaminophen (TYLENOL PM) 25-500 MG TABS tablet Take 2 tablets by mouth at bedtime as needed (sleep).     esomeprazole (NEXIUM) 40 MG capsule Take 40 mg by mouth daily at 12 noon.     furosemide (LASIX) 40 MG tablet Take 1 tablet (40 mg total) by mouth daily. (Patient taking differently: Take 40 mg by mouth daily as needed for fluid.) 30 tablet 3   ibuprofen (ADVIL) 200 MG tablet Take 800 mg by mouth every 6 (six) hours as needed for moderate pain.     levothyroxine (SYNTHROID) 100 MCG tablet Take 1 tablet by mouth daily.     lisinopril (ZESTRIL) 20 MG tablet Take 20 mg by mouth daily.     spironolactone (ALDACTONE) 25 MG tablet Take 0.5 tablets (12.5 mg total) by mouth daily. 45 tablet 3   No current facility-administered medications for this visit.    No Known Allergies    Social History   Socioeconomic History   Marital status: Married    Spouse name: Not on file   Number of children: Not on file   Years  of education: Not on file   Highest education level: Not on file  Occupational History   Not on file  Tobacco Use   Smoking status: Never   Smokeless tobacco: Never  Vaping Use   Vaping status: Never Used  Substance and Sexual Activity   Alcohol use: Yes    Alcohol/week: 14.0 standard drinks of alcohol    Types: 14 Glasses of wine per week    Comment: 2 glasses to bottle a day   Drug use: Never   Sexual activity: Yes  Other Topics Concern   Not on file  Social History Narrative   Not on file   Social Determinants of Health   Financial Resource Strain: Low Risk  (08/20/2022)   Received from [facility], [facility]   Overall Financial Resource Strain (CARDIA)    Difficulty of Paying Living Expenses: Not hard at all  Food Insecurity: No Food Insecurity  (08/20/2022)   Received from [facility], [facility]   Hunger Vital Sign    Worried About Running Out of Food in the Last Year: Never true    Ran Out of Food in the Last Year: Never true  Transportation Needs: No Transportation Needs (08/20/2022)   Received from [facility], [facility]   PRAPARE - Transportation    Lack of Transportation (Medical): No    Lack of Transportation (Non-Medical): No  Physical Activity: Not on file  Stress: No Stress Concern Present (09/14/2022)   Received from [facility], [facility] - Occupational Stress Questionnaire    Feeling of Stress : Only a little  Social Connections: Unknown (11/23/2021)   Received from [facility], [facility]   Social Network    Social Network: Not on file  Intimate Partner Violence: Unknown (10/15/2021)   Received from [facility], [facility]   HITS    Physically Hurt: Not on file    Insult or Talk Down To: Not on file    Threaten Physical Harm: Not on file    Scream or Curse: Not on file      Family History  Problem Relation Age of Onset   Healthy Mother    Healthy Father     Vitals:   03/08/23 1130  BP: 109/79  Pulse: 66  Resp: 14  SpO2: 100%  Weight: 216 lb 2 oz (98 kg)     Wt Readings from Last 3 Encounters:  03/08/23 216 lb 2 oz (98 kg)  11/30/22 221 lb (100.2 kg)  11/24/22 222 lb 3.2 oz (100.8 kg)    PHYSICAL EXAM: General:  Obese. No resp difficulty HEENT: normal Neck: supple. no JVD. Carotids 2+ bilat; no bruits. No lymphadenopathy or thryomegaly appreciated. Cor: PMI nondisplaced. Regular rate & rhythm. No rubs, gallops or murmurs. Lungs: clear mildly decreased Abdomen: soft, nontender, nondistended. No hepatosplenomegaly. No bruits or masses. Good bowel sounds. Extremities: no cyanosis, clubbing, rash, edema Neuro: alert & orientedx3, cranial nerves grossly intact. moves all 4 extremities w/o difficulty. Affect pleasant  ASSESSMENT  & PLAN:  1. Pulmonary HTN - Echo 2/24 EF 45% RV mod reduced Mod TR.  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 Low suspicion for WHO Group I at this point.  - Symptoms much improved with diuresis. Now NYHA I-II - Hall walk with sats > 90% (95%-> 92%) -  Auto-immune serology (negative). - Has seen Pulmonary ([facility]). 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 - Will hold off on VQ for now due to lack of insurance (low suspicion for CTEPH) - Will need to wait until she gets insurance in January to repeat echo and get sleep study and PFTs. Will need eventual RHC  2. Chronic HF with mildly reduced EF - RHC and echo as above.  - Suspect LVEF appears down due to RV strain - symptoms much improved with diuresis. NYHA I-II - Continue lisinopril 20 - Continue spiro 12.5 - Continue carvedilol to 6.25 bid.  - Continue Farxiga - Repeat echo in 3-6 months - Check labs   3. Morbid obesity - Body mass index is 37.1 kg/m. - Consider GLP-1RA - Continue weight loss efforts  4. Snoring  - plan HST as above once she has insurance  Arvilla Meres, MD  11:55 AM

## 2023-03-09 ENCOUNTER — Other Ambulatory Visit: Payer: Self-pay

## 2023-03-09 ENCOUNTER — Telehealth: Payer: Self-pay

## 2023-03-09 NOTE — Progress Notes (Signed)
Pt called stating she received a new rx for lisinpril 20 MG once daily. Pt states she usually takes BID. Also states she doesn't know which dosage she should be taking for carvedilol.   Spoke w/ Dr Gala Romney, pt should be taking lisinopril 20 MG BID and continue current dosage of Carvedilol. Notified pt. Pt states she has been taking carvedilol 6.25 MG BID. Pt agreed to continue dose and had good understanding. Lisinopril sent to pharmacy

## 2023-03-10 ENCOUNTER — Telehealth: Payer: Self-pay | Admitting: Pharmacist

## 2023-03-10 NOTE — Telephone Encounter (Signed)
Farxiga patient assistance approved (via med management). 1st deliver will take 7-10 days.   No answer on patient's phone or emergency contact (Daughter [place]). HIPAA appropriate message left on voice mail.  Geoffrey Mankin Rodriguez-Guzman PharmD, BCPS 03/10/2023 10:21 AM

## 2023-07-16 ENCOUNTER — Encounter: Payer: Self-pay | Admitting: Internal Medicine

## 2023-08-26 ENCOUNTER — Telehealth: Payer: Self-pay | Admitting: Internal Medicine

## 2023-08-26 NOTE — Telephone Encounter (Signed)
Pt confirmed appt for 08/27/23

## 2023-08-27 ENCOUNTER — Ambulatory Visit: Payer: 59 | Attending: Internal Medicine | Admitting: Internal Medicine

## 2023-08-27 ENCOUNTER — Other Ambulatory Visit: Payer: Self-pay | Admitting: *Deleted

## 2023-08-27 VITALS — BP 122/83 | HR 69 | Wt 219.0 lb

## 2023-08-27 DIAGNOSIS — I5022 Chronic systolic (congestive) heart failure: Secondary | ICD-10-CM

## 2023-08-27 DIAGNOSIS — I272 Pulmonary hypertension, unspecified: Secondary | ICD-10-CM

## 2023-08-27 DIAGNOSIS — R0683 Snoring: Secondary | ICD-10-CM

## 2023-08-27 NOTE — Progress Notes (Signed)
ADVANCED HF CLINIC  NOTE  Referring Physician: Eartha Inch, MD Primary Care: Eartha Inch, MD Primary Cardiologist: None  HPI:  Jaclyn Klein is a 58 y/o woman with morbid obesity, HTN referred by Dr. Fransisco Beau for further evaluation of pulmonary HTN.  I saw her in 4/24 for the first visit. Denied h/o known cardiac disease prior to recent diagnosis  Has been overweight for a long time. Max weight 307.  In 2021 lost 97 pounds with diet. Regained 20 pounds. Was on Phen-Phen for several years. Has been told she snores heavily. Not tested for OSA.   Began to feel SOB in 12/23. Got progressively worse to where she was SOB with any activity. + LE edema.    Echo 2/24 EF 45% RV mod reduced Mod TR. Then underwent L/R HC om 09/14/22 at [facility]  No significant CAD. RA 20 RV 94/23 PA 98/38 (59)  PCWP 32 (LVEDP 24)  AO sat 91% PA sat 60% Fick 4.3/2.1  PVR 6.3 PaPi 3.55  At last visit we re-ordered  VQ, PFTs, sleep study and PAH serologies. Serologies negative. All other tests still pending due to lack of insurance (just got coverage)   Here for f/u with her daughter. Feels a lot better. Breathing much improved. No edema, orthopnea or PND. No syncope or presyncope. Daughter says she snores heavily.    Past Medical History:  Diagnosis Date   Hyperlipidemia    Hypertension     Current Outpatient Medications  Medication Sig Dispense Refill   ALPRAZolam (XANAX) 0.5 MG tablet Take 0.5 mg by mouth as needed for anxiety.     atorvastatin (LIPITOR) 80 MG tablet Take 1 tablet by mouth daily.     carvedilol (COREG) 6.25 MG tablet Take 2 tablets (12.5 mg total) by mouth 2 (two) times daily with a meal. (Patient taking differently: Take 6.25 mg by mouth 2 (two) times daily with a meal.) 180 tablet 3   dapagliflozin propanediol (FARXIGA) 10 MG TABS tablet Take 1 tablet (10 mg total) by mouth daily before breakfast. 30 tablet 6   diphenhydramine-acetaminophen (TYLENOL PM) 25-500 MG TABS tablet  Take 2 tablets by mouth at bedtime as needed (sleep).     esomeprazole (NEXIUM) 40 MG capsule Take 40 mg by mouth daily at 12 noon.     furosemide (LASIX) 40 MG tablet Take 1 tablet (40 mg total) by mouth daily. (Patient taking differently: Take 40 mg by mouth daily as needed for fluid.) 30 tablet 3   ibuprofen (ADVIL) 200 MG tablet Take 800 mg by mouth every 6 (six) hours as needed for moderate pain.     levothyroxine (SYNTHROID) 100 MCG tablet Take 1 tablet by mouth daily.     lisinopril (ZESTRIL) 20 MG tablet Take 1 tablet (20 mg total) by mouth daily. 90 tablet 3   spironolactone (ALDACTONE) 25 MG tablet Take 0.5 tablets (12.5 mg total) by mouth daily. 45 tablet 3   acetaminophen (TYLENOL) 325 MG tablet Take 2 tablets (650 mg total) by mouth every 6 (six) hours as needed for mild pain (or Fever >/= 101). (Patient not taking: Reported on 08/27/2023) 12 tablet 0   No current facility-administered medications for this visit.    No Known Allergies    Social History   Socioeconomic History   Marital status: Married    Spouse name: Not on file   Number of children: Not on file   Years of education: Not on file   Highest education level: Not  on file  Occupational History   Not on file  Tobacco Use   Smoking status: Never   Smokeless tobacco: Never  Vaping Use   Vaping status: Never Used  Substance and Sexual Activity   Alcohol use: Yes    Alcohol/week: 14.0 standard drinks of alcohol    Types: 14 Glasses of wine per week    Comment: 2 glasses to bottle a day   Drug use: Never   Sexual activity: Yes  Other Topics Concern   Not on file  Social History Narrative   Not on file   Social Drivers of Health   Financial Resource Strain: Low Risk  (07/29/2023)   Received from [facility]   Overall Financial Resource Strain (CARDIA)    Difficulty of Paying Living Expenses: Not hard at all  Food Insecurity: No Food Insecurity (07/29/2023)   Received from [facility]   Hunger Vital  Sign    Worried About Running Out of Food in the Last Year: Never true    Ran Out of Food in the Last Year: Never true  Transportation Needs: No Transportation Needs (07/29/2023)   Received from [facility] - Transportation    Lack of Transportation (Medical): No    Lack of Transportation (Non-Medical): No  Physical Activity: Not on file  Stress: No Stress Concern Present (09/14/2022)   Received from [facility], [facility] of Occupational Health - Occupational Stress Questionnaire    Feeling of Stress : Only a little  Social Connections: Unknown (11/23/2021)   Received from [facility], [facility]   Social Network    Social Network: Not on file  Intimate Partner Violence: Unknown (10/15/2021)   Received from [facility], [facility]   HITS    Physically Hurt: Not on file    Insult or Talk Down To: Not on file    Threaten Physical Harm: Not on file    Scream or Curse: Not on file      Family History  Problem Relation Age of Onset   Healthy Mother    Healthy Father     Vitals:   08/27/23 1157  BP: 122/83  Pulse: 69  SpO2: 97%  Weight: 219 lb (99.3 kg)     Wt Readings from Last 3 Encounters:  08/27/23 219 lb (99.3 kg)  03/08/23 216 lb 2 oz (98 kg)  11/30/22 221 lb (100.2 kg)    PHYSICAL EXAM: General:  Sitting in chair. No resp difficulty HEENT: normal Neck: supple. no JVD. Carotids 2+ bilat; no bruits. No lymphadenopathy or thryomegaly appreciated. Cor: PMI nondisplaced. Regular rate & rhythm. No rubs, gallops or murmurs. Lungs: clear Abdomen: obese soft, nontender, nondistended. No hepatosplenomegaly. No bruits or masses. Good bowel sounds. Extremities: no cyanosis, clubbing, rash, edema Neuro: alert & orientedx3, cranial nerves grossly intact. moves all 4 extremities w/o difficulty. Affect pleasant   ASSESSMENT & PLAN:  1. Pulmonary HTN - Echo 2/24 EF 45% RV mod reduced Mod TR.  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 Low suspicion for WHO Group I at this point.  - Symptoms much improved with diuresis. Now NYHA I-II - Hall walk with sats > 90% (95%-> 92%) - Auto-immune serology (negative). - Has seen Pulmonary ([facility]). 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 - Will hold  off on VQ for now due to lack of insurance (low suspicion for CTEPH) - She now has insurance will plan sleep study, repeat echo and RHC. RHC discussed personally with her and her daughter.   2. Chronic HF with mildly reduced EF - RHC and echo as above.  - Suspect LVEF appears down due to RV strain - Improved with diuresis. NYHA II - Volume status looks good - Continue lisinopril 20 - Continue spiro 12.5 - Continue carvedilol to 6.25 bid.  - Continue Marcelline Deist - Repeat echo  - Check labs  3. Morbid obesity - Body mass index is 37.59 kg/m. - Will need GLP1-RA  4. Snoring  - Suspect OSA. Have ordered HST  Arvilla Meres, MD  12:31 PM

## 2023-08-27 NOTE — Patient Instructions (Signed)
Great to see you today!!!  Medication Changes:  None, continue current medications  Lab Work:  Will be needed the week before your procedure (week of 3/10), we have provided you an order to have this done locally at [facility]  Testing/Procedures:  Heart Catheterization 09/27/23, see instructions below  Your physician has requested that you have an echocardiogram. Echocardiography is a painless test that uses sound waves to create images of your heart. It provides your doctor with information about the size and shape of your heart and how well your heart's chambers and valves are working. This procedure takes approximately one hour. There are no restrictions for this procedure. Please do NOT wear cologne, perfume, aftershave, or lotions (deodorant is allowed). Please arrive 15 minutes prior to your appointment time. THIS WILL BE DONE AT [facility], THEY WILL CALL YOU TO SCHEDULE  Please note: We ask at that you not bring children with you during ultrasound (echo/ vascular) testing. Due to room size and safety concerns, children are not allowed in the ultrasound rooms during exams. Our front office staff cannot provide observation of children in our lobby area while testing is being conducted. An adult accompanying a patient to their appointment will only be allowed in the ultrasound room at the discretion of the ultrasound technician under special circumstances. We apologize for any inconvenience.  Your provider has recommended that you have a home sleep study ([facility] Test).  We have provided you with the equipment in our office today. Please go ahead and download the app. DO NOT OPEN OR TAMPER WITH THE BOX UNTIL WE ADVISE YOU TO DO SO. Once insurance has approved the test our office will call you with PIN number and approval to proceed with testing. Once you have completed the test you just dispose of the equipment, the information is automatically uploaded to [place] via blue-tooth technology. If your  test is positive for sleep apnea and you need a home CPAP machine you will be contacted by Dr Norris Cross office [facility]) to set this up.  Special Instructions // Education:  Do the following things EVERYDAY: Weigh yourself in the morning before breakfast. Write it down and keep it in a log. Take your medicines as prescribed Eat low salt foods--Limit salt (sodium) to 2000 mg per day.  Stay as active as you can everyday Limit all fluids for the day to less than 2 liters   CATHETERIZATION INSTRUCTIONS:  You are scheduled for a Cardiac Catheterization on Monday, March 17 with Dr. Arvilla Meres.  1. Please arrive at the [facility] (Main Entrance A) at [facility]: [address] [place], [place] [address] at 10 AM (This time is 2 hour(s) before your procedure to ensure your preparation).   Free valet parking service is available. You will check in at ADMITTING. The support person will be asked to wait in the waiting room.  It is OK to have someone drop you off and come back when you are ready to be discharged.    Special note: Every effort is made to have your procedure done on time. Please understand that emergencies sometimes delay scheduled procedures.  2. Diet: Do not eat solid foods after midnight.  The patient may have clear liquids until 5am upon the day of the procedure.  3. Labs: PLEASE GO TO [facility] MONDAY 09/20/23 FOR LAB WORK  4. Medication instructions in preparation for your procedure:   Monday 3/17 AM DO NOT TAKE: Farxiga, Furosemide, or Sprionolactone  On the  morning of your procedure, take any morning medicines NOT listed above.  You may use sips of water.  5. Plan to go home the same day, you will only stay overnight if medically necessary. 6. Bring a current list of your medications and current insurance cards. 7. You MUST have a responsible person to drive you home. 8. Someone MUST be with you the first 24 hours after you arrive home or your  discharge will be delayed. 9. Please wear clothes that are easy to get on and off and wear slip-on shoes.  Thank you for allowing [place] to care for you!   -- [facility]   Follow-Up in: 3 MONTHS (May 2025), we will call you closer to this time to schedule    If you have any questions or concerns before your next appointment please send [place] a message through Pacific or call our office at [phone] Monday-Friday 8 am-5 pm.   If you have an urgent need after hours on the weekend please call your Primary Cardiologist or the [facility] in [place] at [phone].   At the [facility], you and your health needs are our priority. We have a designated team specialized in the treatment of Heart Failure. This Care Team includes your primary Heart Failure Specialized Cardiologist (physician), Advanced Practice Providers (APPs- Physician Assistants and Nurse Practitioners), and Pharmacist who all work together to provide you with the care you need, when you need it.   You may see any of the following providers on your designated Care Team at your next follow up:  Dr. Arvilla Meres Dr. Marca Ancona Dr. Dorthula Nettles Dr. Theresia Bough Tonye Becket, NP Robbie Lis, [place] [address] [place], [place] Brynda Peon, NP [place] Lee, NP Clarisa Kindred, NP Enos Fling, PharmD

## 2023-08-27 NOTE — Progress Notes (Signed)
Height: 5'4"    Weight: 219 lb BMI: 37.59  Today's Date: 08/27/23  STOP BANG RISK ASSESSMENT S (snore) Have you been told that you snore?     YES   T (tired) Are you often tired, fatigued, or sleepy during the day?   YES  O (obstruction) Do you stop breathing, choke, or gasp during sleep? NO   P (pressure) Do you have or are you being treated for high blood pressure? YES   B (BMI) Is your body index greater than 35 kg/m? YES   A (age) Are you 58 years old or older? YES   N (neck) Do you have a neck circumference greater than 16 inches?      G (gender) Are you a female? NO   TOTAL STOP/BANG "YES" ANSWERS 5                                                                       For Office Use Only              Procedure Order Form    YES to 3+ Stop Bang questions OR two clinical symptoms - patient qualifies for WatchPAT (CPT 95800)      Clinical Notes: Will consult Sleep Specialist and refer for management of therapy due to patient increased risk of Sleep Apnea. Ordering a sleep study due to the following two clinical symptoms: Excessive daytime sleepiness G47.10 / History of high blood pressure R03.0      ITAMAR home sleep study given to patient, all instructions explained, waiver signed, and CLOUDPAT registration complete.

## 2023-09-01 ENCOUNTER — Other Ambulatory Visit: Payer: Self-pay

## 2023-09-06 ENCOUNTER — Other Ambulatory Visit: Payer: Self-pay

## 2023-09-06 MED ORDER — DAPAGLIFLOZIN PROPANEDIOL 10 MG PO TABS
10.0000 mg | ORAL_TABLET | Freq: Every day | ORAL | 3 refills | Status: DC
  Filled 2023-09-06: qty 90, 90d supply, fill #0

## 2023-09-06 NOTE — Telephone Encounter (Signed)
 Marcelline Deist Rx sent per [facility] request to process patient assistance for [place] through [facility] Me.

## 2023-09-10 ENCOUNTER — Ambulatory Visit (HOSPITAL_COMMUNITY)
Admission: RE | Admit: 2023-09-10 | Discharge: 2023-09-10 | Disposition: A | Discharge status: Home / Self Care | Payer: 59 | Source: Ambulatory Visit | Attending: Internal Medicine | Admitting: Internal Medicine

## 2023-09-10 DIAGNOSIS — I272 Pulmonary hypertension, unspecified: Secondary | ICD-10-CM | POA: Insufficient documentation

## 2023-09-10 DIAGNOSIS — I5022 Chronic systolic (congestive) heart failure: Secondary | ICD-10-CM | POA: Diagnosis present

## 2023-09-10 LAB — ECHOCARDIOGRAM COMPLETE
AR max vel: 2.57 cm2
AV Area VTI: 2.86 cm2
AV Area mean vel: 2.79 cm2
AV Mean grad: 1.7 mm[Hg]
AV Peak grad: 3.7 mm[Hg]
Ao pk vel: 0.96 m/s
Area-P 1/2: 2.37 cm2
S' Lateral: 3 cm

## 2023-09-10 MED ORDER — PERFLUTREN LIPID MICROSPHERE
1.0000 mL | INTRAVENOUS | Status: AC | PRN
  Administered 2023-09-10: 4 mL via INTRAVENOUS

## 2023-09-10 NOTE — Progress Notes (Signed)
*  PRELIMINARY RESULTS* Echocardiogram 2D Echocardiogram has been performed with [facility].  Stacey Drain 09/10/2023, 4:23 PM

## 2023-09-13 ENCOUNTER — Other Ambulatory Visit: Payer: Self-pay

## 2023-09-20 ENCOUNTER — Other Ambulatory Visit (HOSPITAL_COMMUNITY)
Admission: RE | Admit: 2023-09-20 | Discharge: 2023-09-20 | Disposition: A | Discharge status: Home / Self Care | Source: Ambulatory Visit | Attending: Internal Medicine | Admitting: Internal Medicine

## 2023-09-20 DIAGNOSIS — I5022 Chronic systolic (congestive) heart failure: Secondary | ICD-10-CM | POA: Insufficient documentation

## 2023-09-20 LAB — BASIC METABOLIC PANEL
Anion gap: 7 (ref 5–15)
BUN: 25 mg/dL — ABNORMAL HIGH (ref 6–20)
CO2: 23 mmol/L (ref 22–32)
Calcium: 9 mg/dL (ref 8.9–10.3)
Chloride: 110 mmol/L (ref 98–111)
Creatinine, Ser: 0.96 mg/dL (ref 0.44–1.00)
GFR, Estimated: >60 mL/min (ref >60)
Glucose, Bld: 105 mg/dL — ABNORMAL HIGH (ref 70–99)
Potassium: 3.8 mmol/L (ref 3.5–5.1)
Sodium: 140 mmol/L (ref 135–145)

## 2023-09-20 LAB — CBC
HCT: 49.4 % — ABNORMAL HIGH (ref 36.0–46.0)
Hemoglobin: 16 g/dL — ABNORMAL HIGH (ref 12.0–15.0)
MCH: 30.3 pg (ref 26.0–34.0)
MCHC: 32.4 g/dL (ref 30.0–36.0)
MCV: 93.6 fL (ref 80.0–100.0)
Platelets: 119 10*3/uL — ABNORMAL LOW (ref 150–400)
RBC: 5.28 MIL/uL — ABNORMAL HIGH (ref 3.87–5.11)
RDW: 14.7 % (ref 11.5–15.5)
WBC: 5.4 10*3/uL (ref 4.0–10.5)
nRBC: 0 % (ref 0.0–0.2)

## 2023-09-27 ENCOUNTER — Ambulatory Visit (HOSPITAL_COMMUNITY)
Admission: RE | Admit: 2023-09-27 | Discharge: 2023-09-27 | Disposition: A | Discharge status: Home / Self Care | Payer: 59 | Attending: Internal Medicine | Admitting: Internal Medicine

## 2023-09-27 ENCOUNTER — Other Ambulatory Visit: Payer: Self-pay

## 2023-09-27 ENCOUNTER — Encounter (HOSPITAL_COMMUNITY): Payer: Self-pay | Admitting: Internal Medicine

## 2023-09-27 ENCOUNTER — Encounter (HOSPITAL_COMMUNITY)
Admission: RE | Disposition: A | Discharge status: Home / Self Care | Payer: Self-pay | Source: Home / Self Care | Attending: Internal Medicine

## 2023-09-27 DIAGNOSIS — I2721 Secondary pulmonary arterial hypertension: Secondary | ICD-10-CM | POA: Diagnosis present

## 2023-09-27 DIAGNOSIS — R0683 Snoring: Secondary | ICD-10-CM | POA: Insufficient documentation

## 2023-09-27 DIAGNOSIS — Z6838 Body mass index (BMI) 38.0-38.9, adult: Secondary | ICD-10-CM | POA: Insufficient documentation

## 2023-09-27 DIAGNOSIS — I2781 Cor pulmonale (chronic): Secondary | ICD-10-CM | POA: Insufficient documentation

## 2023-09-27 DIAGNOSIS — I5022 Chronic systolic (congestive) heart failure: Secondary | ICD-10-CM | POA: Insufficient documentation

## 2023-09-27 DIAGNOSIS — I10 Essential (primary) hypertension: Secondary | ICD-10-CM | POA: Diagnosis not present

## 2023-09-27 DIAGNOSIS — Z79899 Other long term (current) drug therapy: Secondary | ICD-10-CM | POA: Insufficient documentation

## 2023-09-27 HISTORY — PX: RIGHT HEART CATH: CATH118263

## 2023-09-27 LAB — POCT I-STAT EG7
Acid-base deficit: 2 mmol/L (ref 0.0–2.0)
Acid-base deficit: 3 mmol/L — ABNORMAL HIGH (ref 0.0–2.0)
Bicarbonate: 22.8 mmol/L (ref 20.0–28.0)
Bicarbonate: 23.3 mmol/L (ref 20.0–28.0)
Calcium, Ion: 1.17 mmol/L (ref 1.15–1.40)
Calcium, Ion: 1.22 mmol/L (ref 1.15–1.40)
HCT: 43 % (ref 36.0–46.0)
HCT: 44 % (ref 36.0–46.0)
Hemoglobin: 14.6 g/dL (ref 12.0–15.0)
Hemoglobin: 15 g/dL (ref 12.0–15.0)
O2 Saturation: 63 %
O2 Saturation: 66 %
Potassium: 3.5 mmol/L (ref 3.5–5.1)
Potassium: 3.6 mmol/L (ref 3.5–5.1)
Sodium: 143 mmol/L (ref 135–145)
Sodium: 143 mmol/L (ref 135–145)
TCO2: 24 mmol/L (ref 22–32)
TCO2: 25 mmol/L (ref 22–32)
pCO2, Ven: 40.6 mmHg — ABNORMAL LOW (ref 44–60)
pCO2, Ven: 41.6 mmHg — ABNORMAL LOW (ref 44–60)
pH, Ven: 7.356 (ref 7.25–7.43)
pH, Ven: 7.358 (ref 7.25–7.43)
pO2, Ven: 34 mmHg (ref 32–45)
pO2, Ven: 36 mmHg (ref 32–45)

## 2023-09-27 SURGERY — RIGHT HEART CATH
Anesthesia: LOCAL

## 2023-09-27 MED ORDER — LABETALOL HCL 5 MG/ML IV SOLN: 10.0000 mg | INTRAVENOUS | Status: DC | PRN

## 2023-09-27 MED ORDER — ASPIRIN 81 MG PO CHEW
81.0000 mg | CHEWABLE_TABLET | ORAL | Status: AC
  Administered 2023-09-27: 81 mg via ORAL
  Filled 2023-09-27: qty 1

## 2023-09-27 MED ORDER — ACETAMINOPHEN 325 MG PO TABS: 650.0000 mg | ORAL_TABLET | ORAL | Status: DC | PRN

## 2023-09-27 MED ORDER — MIDAZOLAM HCL 2 MG/2ML IJ SOLN
INTRAMUSCULAR | Status: AC
  Filled 2023-09-27: qty 2

## 2023-09-27 MED ORDER — SODIUM CHLORIDE 0.9 % IV SOLN: INTRAVENOUS | Status: DC

## 2023-09-27 MED ORDER — FENTANYL CITRATE (PF) 100 MCG/2ML IJ SOLN
INTRAMUSCULAR | Status: AC
  Filled 2023-09-27: qty 2

## 2023-09-27 MED ORDER — LIDOCAINE HCL (PF) 1 % IJ SOLN
INTRAMUSCULAR | Status: AC
  Filled 2023-09-27: qty 30

## 2023-09-27 MED ORDER — ONDANSETRON HCL 4 MG/2ML IJ SOLN: 4.0000 mg | Freq: Four times a day (QID) | INTRAMUSCULAR | Status: DC | PRN

## 2023-09-27 MED ORDER — SODIUM CHLORIDE 0.9 % IV SOLN: 250.0000 mL | INTRAVENOUS | Status: DC | PRN

## 2023-09-27 MED ORDER — MIDAZOLAM HCL 2 MG/2ML IJ SOLN
INTRAMUSCULAR | Status: DC | PRN
  Administered 2023-09-27: 1 mg via INTRAVENOUS

## 2023-09-27 MED ORDER — HEPARIN (PORCINE) IN NACL 1000-0.9 UT/500ML-% IV SOLN
INTRAVENOUS | Status: DC | PRN
  Administered 2023-09-27: 1000 mL

## 2023-09-27 MED ORDER — LIDOCAINE HCL (PF) 1 % IJ SOLN
INTRAMUSCULAR | Status: DC | PRN
  Administered 2023-09-27: 2 mL

## 2023-09-27 MED ORDER — HYDRALAZINE HCL 20 MG/ML IJ SOLN: 10.0000 mg | INTRAMUSCULAR | Status: DC | PRN

## 2023-09-27 MED ORDER — FENTANYL CITRATE (PF) 100 MCG/2ML IJ SOLN
INTRAMUSCULAR | Status: DC | PRN
  Administered 2023-09-27: 25 ug via INTRAVENOUS

## 2023-09-27 MED ORDER — SODIUM CHLORIDE 0.9% FLUSH: 3.0000 mL | Freq: Two times a day (BID) | INTRAVENOUS | Status: DC

## 2023-09-27 MED ORDER — SODIUM CHLORIDE 0.9% FLUSH: 3.0000 mL | INTRAVENOUS | Status: DC | PRN

## 2023-09-27 SURGICAL SUPPLY — 6 items
CATH SWAN GANZ 7F STRAIGHT (CATHETERS) IMPLANT
GLIDESHEATH SLENDER 7FR .021G (SHEATH) IMPLANT
GUIDEWIRE .025 260CM (WIRE) IMPLANT
PACK CARDIAC CATHETERIZATION (CUSTOM PROCEDURE TRAY) ×1 IMPLANT
TRANSDUCER W/STOPCOCK (MISCELLANEOUS) IMPLANT
TUBING ART PRESS 72 MALE/FEM (TUBING) IMPLANT

## 2023-09-27 NOTE — H&P (Signed)
 [facility]  NOTE   Referring Physician: Eartha Inch, MD Primary Care: Eartha Inch, MD Primary Cardiologist: None   HPI:   Jaclyn Klein is a 58 y/o woman with morbid obesity, HTN referred by Dr. Fransisco Beau for further evaluation of pulmonary HTN.   I saw her in 4/24 for the first visit. Denied h/o known cardiac disease prior to recent diagnosis   Has been overweight for a long time. Max weight 307.  In 2021 lost 97 pounds with diet. Regained 20 pounds. Was on Phen-Phen for several years. Has been told she snores heavily. Not tested for OSA.    Began to feel SOB in 12/23. Got progressively worse to where she was SOB with any activity. + LE edema.     Echo 2/24 EF 45% RV mod reduced Mod TR. Then underwent L/R HC om 09/14/22 at [facility]   No significant CAD. RA 20 RV 94/23 PA 98/38 (59)  PCWP 32 (LVEDP 24)  AO sat 91% PA sat 60% Fick 4.3/2.1  PVR 6.3 PaPi 3.55   At last visit we re-ordered  VQ, PFTs, sleep study and PAH serologies. Serologies negative. All other tests still pending due to lack of insurance (just got coverage)     We saw her recently in [place] and remained SOB with mild exertion. Presents today for RHC         Past Medical History:  Diagnosis Date   Hyperlipidemia     Hypertension                  Current Outpatient Medications  Medication Sig Dispense Refill   ALPRAZolam (XANAX) 0.5 MG tablet Take 0.5 mg by mouth as needed for anxiety.       atorvastatin (LIPITOR) 80 MG tablet Take 1 tablet by mouth daily.       carvedilol (COREG) 6.25 MG tablet Take 2 tablets (12.5 mg total) by mouth 2 (two) times daily with a meal. (Patient taking differently: Take 6.25 mg by mouth 2 (two) times daily with a meal.) 180 tablet 3   dapagliflozin propanediol (FARXIGA) 10 MG TABS tablet Take 1 tablet (10 mg total) by mouth daily before breakfast. 30 tablet 6   diphenhydramine-acetaminophen (TYLENOL PM) 25-500 MG TABS tablet Take 2 tablets by mouth at bedtime as  needed (sleep).       esomeprazole (NEXIUM) 40 MG capsule Take 40 mg by mouth daily at 12 noon.       furosemide (LASIX) 40 MG tablet Take 1 tablet (40 mg total) by mouth daily. (Patient taking differently: Take 40 mg by mouth daily as needed for fluid.) 30 tablet 3   ibuprofen (ADVIL) 200 MG tablet Take 800 mg by mouth every 6 (six) hours as needed for moderate pain.       levothyroxine (SYNTHROID) 100 MCG tablet Take 1 tablet by mouth daily.       lisinopril (ZESTRIL) 20 MG tablet Take 1 tablet (20 mg total) by mouth daily. 90 tablet 3   spironolactone (ALDACTONE) 25 MG tablet Take 0.5 tablets (12.5 mg total) by mouth daily. 45 tablet 3   acetaminophen (TYLENOL) 325 MG tablet Take 2 tablets (650 mg total) by mouth every 6 (six) hours as needed for mild pain (or Fever >/= 101). (Patient not taking: Reported on 08/27/2023) 12 tablet 0      No current facility-administered medications for this visit.        Allergies  No Known Allergies  Social History         Socioeconomic History   Marital status: Married      Spouse name: Not on file   Number of children: Not on file   Years of education: Not on file   Highest education level: Not on file  Occupational History   Not on file  Tobacco Use   Smoking status: Never   Smokeless tobacco: Never  Vaping Use   Vaping status: Never Used  Substance and Sexual Activity   Alcohol use: Yes      Alcohol/week: 14.0 standard drinks of alcohol      Types: 14 Glasses of wine per week      Comment: 2 glasses to bottle a day   Drug use: Never   Sexual activity: Yes  Other Topics Concern   Not on file  Social History Narrative   Not on file    Social Drivers of Health        Financial Resource Strain: Low Risk  (07/29/2023)    [facility]    Overall Financial Resource Strain (CARDIA)     Difficulty of Paying Living Expenses: Not hard at all  Food Insecurity: No Food Insecurity (07/29/2023)    Received from [facility]    Hunger Vital Sign     Worried About Running Out of Food in the Last Year: Never true     Ran Out of Food in the Last Year: Never true  Transportation Needs: No Transportation Needs (07/29/2023)    [facility] - Transportation     Lack of Transportation (Medical): No     Lack of Transportation (Non-Medical): No  Physical Activity: Not on file  Stress: No Stress Concern Present (09/14/2022)    Received from [facility], [facility] of Occupational Health - Occupational Stress Questionnaire     Feeling of Stress : Only a little  Social Connections: Unknown (11/23/2021)    Received from [facility], [facility]    Social Network     Social Network: Not on file  Intimate Partner Violence: Unknown (10/15/2021)    Received from [facility], [facility]    HITS     Physically Hurt: Not on file     Insult or Talk Down To: Not on file     Threaten Physical Harm: Not on file     Scream or Curse: Not on file             Family History  Problem Relation Age of Onset   Healthy Mother     Healthy Father            Vitals:   09/27/23 1036 09/27/23 1303  BP: 112/77   Pulse: 63   Resp: 18   Temp: 97.8 F (36.6 C)   SpO2: 93% 95%      PHYSICAL EXAM: General:  Well appearing. No resp difficulty HEENT: normal Neck: supple. no JVD. Carotids 2+ bilat; no bruits. No lymphadenopathy or thryomegaly appreciated. Cor: PMI nondisplaced. Regular rate & rhythm. No rubs, gallops or murmurs. Lungs: clear Abdomen: obese soft, nontender, nondistended. No hepatosplenomegaly. No bruits or masses. Good bowel sounds. Extremities: no cyanosis, clubbing, rash, edema Neuro: alert & orientedx3, cranial nerves grossly intact. moves all 4 extremities w/o difficulty. Affect pleasant  ASSESSMENT & PLAN:   1. Pulmonary HTN - Echo 2/24 EF 45% RV mod reduced Mod TR.  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 Low suspicion for WHO Group I at this point.  - Symptoms much improved with diuresis. Now NYHA I-II - Hall walk with sats > 90% (95%-> 92%) - Auto-immune serology (negative). - Has seen Pulmonary ([facility]ucker). 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 - Will hold off on VQ for now due to lack of insurance (low suspicion for CTEPH) - Remains symptomatic. NYHA II-III - For RHC today with eye toward starting therapies   2. Chronic HF with mildly reduced EF - RHC and echo as above.  - Suspect LVEF appears down due to RV strain - Improved with diuresis but remains NYHA II-III - Volume status looks good - Continue lisinopril 20 - Continue spiro 12.5 - Continue carvedilol to 6.25 bid.  - Continue Marcelline Deist - RHC today   3. Morbid obesity -Body mass index is 38.79 kg/m. - Will need GLP1-RA   4. Snoring  - Suspect OSA. Have ordered HST   Arvilla Meres, MD

## 2023-09-27 NOTE — Discharge Instructions (Signed)

## 2023-09-30 ENCOUNTER — Telehealth: Payer: Self-pay | Admitting: Internal Medicine

## 2023-10-04 ENCOUNTER — Telehealth: Payer: Self-pay

## 2023-10-04 NOTE — Telephone Encounter (Signed)
 Pt daughter previously called to receive results for cath. Pt returning call. Advise pt that her cath would be discussed at next appointment. Also advised that she should complete her itamar sleep study. Pt verbalized understanding and agreeable to plan.   Itamar sleep study: no precert required.

## 2023-10-06 ENCOUNTER — Other Ambulatory Visit (HOSPITAL_COMMUNITY): Payer: Self-pay | Admitting: Internal Medicine

## 2023-10-11 NOTE — Telephone Encounter (Signed)
 See 3/24 note

## 2023-10-15 ENCOUNTER — Other Ambulatory Visit (HOSPITAL_COMMUNITY): Payer: Self-pay | Admitting: Internal Medicine

## 2023-10-16 ENCOUNTER — Encounter (INDEPENDENT_AMBULATORY_CARE_PROVIDER_SITE_OTHER): Payer: Self-pay | Admitting: Cardiology

## 2023-10-16 DIAGNOSIS — G4733 Obstructive sleep apnea (adult) (pediatric): Secondary | ICD-10-CM | POA: Diagnosis not present

## 2023-10-17 NOTE — Procedures (Signed)
    SLEEP STUDY REPORT Patient Information Study Date: 10/16/2023 Patient Name: Jaclyn Klein Patient ID: [id number] Birth Date: [birth year] Age: 58 Gender: Female BMI: 37.3 (W=218 lb, H=5' 4'') Stopbang: 5 Referring Physician: Arvilla Meres, MD  TEST DESCRIPTION: Home sleep apnea testing was completed using the WatchPat, a Type 1 device, utilizing peripheral arterial tonometry (PAT), chest movement, actigraphy, pulse oximetry, pulse rate, body position and snore. AHI was calculated with apnea and hypopnea using valid sleep time as the denominator. RDI includes apneas, hypopneas, and RERAs. The data acquired and the scoring of sleep and all associated events were performed in accordance with the recommended standards and specifications as outlined in the AASM Manual for the Scoring of Sleep and Associated Events 2.2.0 (2015).  FINDINGS:  1. Severe Obstructive Sleep Apnea with AHI 38.3/hr.  2. Central Sleep Apnea with pAHIc 2.3/hr.  3. Oxygen desaturations as low as 63%.  4. Severe snoring was present. O2 sats were < 88% for 619.6 min.  5. Total sleep time was 8 hrs and 17 min.  6. 10% of total sleep time was spent in REM sleep.  7. Normal sleep onset latency at 17 min.  8. Prolonged REM sleep onset latency at 191 min.  9. Total awakenings were .5 10. Arrhythmia detection: None.  DIAGNOSIS: Severe Obstructive Sleep Apnea (G47.33) Nocturnal Hypoxemia  RECOMMENDATIONS: 1. Clinical correlation of these findings is necessary. The decision to treat obstructive sleep apnea (OSA) is usually based on the presence of apnea symptoms or the presence of associated medical conditions such as Hypertension, Congestive Heart Failure, Atrial Fibrillation or Obesity. The most common symptoms of OSA are snoring, gasping for breath while sleeping, daytime sleepiness and fatigue.  2. Initiating apnea therapy is recommended given the presence of symptoms and/or associated  conditions. Recommend proceeding with one of the following:   a. Auto-CPAP therapy with a pressure range of 5-20cm H2O.   b. An oral appliance (OA) that can be obtained from certain dentists with expertise in sleep medicine. These are primarily of use in non-obese patients with mild and moderate disease.   c. An ENT consultation which may be useful to look for specific causes of obstruction and possible treatment options.   d. If patient is intolerant to PAP therapy, consider referral to ENT for evaluation for hypoglossal nerve stimulator.  3. Close follow-up is necessary to ensure success with CPAP or oral appliance therapy for maximum benefit .  4. A follow-up oximetry study on CPAP is recommended to assess the adequacy of therapy and determine the need for supplemental oxygen or the potential need for Bi-level therapy. An arterial blood gas to determine the adequacy of baseline ventilation and oxygenation should also be considered.  5. Healthy sleep recommendations include: adequate nightly sleep (normal 7-9 hrs/night), avoidance of caffeine after noon and alcohol near bedtime, and maintaining a sleep environment that is cool, dark and quiet.  6. Weight loss for overweight patients is recommended. Even modest amounts of weight loss can significantly improve the severity of sleep apnea.  7. Snoring recommendations include: weight loss where appropriate, side sleeping, and avoidance of alcohol before bed.  8. Operation of motor vehicle should be avoided when sleepy.  Signature: Armanda Magic, MD; [facility]; Diplomat, American Board of Sleep Medicine Electronically Signed: 10/17/2023 9:20:35 AM

## 2023-10-18 ENCOUNTER — Telehealth: Payer: Self-pay | Admitting: Internal Medicine

## 2023-10-19 ENCOUNTER — Telehealth: Payer: Self-pay | Admitting: Internal Medicine

## 2023-10-20 ENCOUNTER — Telehealth: Payer: Self-pay

## 2023-10-20 ENCOUNTER — Other Ambulatory Visit: Payer: Self-pay

## 2023-10-20 DIAGNOSIS — G4733 Obstructive sleep apnea (adult) (pediatric): Secondary | ICD-10-CM

## 2023-10-20 DIAGNOSIS — I272 Pulmonary hypertension, unspecified: Secondary | ICD-10-CM

## 2023-10-20 DIAGNOSIS — I158 Other secondary hypertension: Secondary | ICD-10-CM

## 2023-10-20 DIAGNOSIS — R0683 Snoring: Secondary | ICD-10-CM

## 2023-10-20 DIAGNOSIS — I5022 Chronic systolic (congestive) heart failure: Secondary | ICD-10-CM

## 2023-10-20 MED ORDER — FUROSEMIDE 40 MG PO TABS: 40.0000 mg | ORAL_TABLET | Freq: Every day | ORAL | 3 refills | Status: DC

## 2023-10-20 NOTE — Telephone Encounter (Signed)
-----   Message from Armanda Magic sent at 10/17/2023  9:21 AM EDT ----- Please let patient know that they have sleep apnea.  Recommend therapeutic CPAP titration for treatment of patient's sleep disordered breathing.

## 2023-10-20 NOTE — Telephone Encounter (Signed)
 Left VM with callback number for patient to receive sleep study results and recommendations.

## 2023-10-20 NOTE — Telephone Encounter (Signed)
Notified patient of sleep study results and recommendations. All questions (if any) were answered. Patient verbalized understanding. CPAP Titration ordered today.

## 2023-10-21 ENCOUNTER — Encounter: Admitting: Internal Medicine

## 2023-10-26 NOTE — Progress Notes (Signed)
 Medication refills sent in to pharmacy by Autry Legions, RN.

## 2023-11-01 ENCOUNTER — Telehealth: Payer: Self-pay | Admitting: Internal Medicine

## 2023-11-01 NOTE — Telephone Encounter (Signed)
 Called to confirm/remind patient of their appointment at the [facility] on 11/02/23.   Appointment:   [] Confirmed  [x] Left mess   [] No answer/No voice mail  [] VM Full/unable to leave message  [] Phone not in service  Patient reminded to bring all medications and/or complete list.  Confirmed patient has transportation. Gave directions, instructed to utilize valet parking.

## 2023-11-02 ENCOUNTER — Other Ambulatory Visit (HOSPITAL_COMMUNITY): Payer: Self-pay

## 2023-11-02 ENCOUNTER — Ambulatory Visit: Payer: Self-pay | Attending: Internal Medicine | Admitting: Internal Medicine

## 2023-11-02 VITALS — BP 149/92 | HR 61 | Wt 229.0 lb

## 2023-11-02 DIAGNOSIS — I5022 Chronic systolic (congestive) heart failure: Secondary | ICD-10-CM | POA: Diagnosis not present

## 2023-11-02 DIAGNOSIS — I272 Pulmonary hypertension, unspecified: Secondary | ICD-10-CM | POA: Insufficient documentation

## 2023-11-02 DIAGNOSIS — I5032 Chronic diastolic (congestive) heart failure: Secondary | ICD-10-CM | POA: Insufficient documentation

## 2023-11-02 DIAGNOSIS — G4733 Obstructive sleep apnea (adult) (pediatric): Secondary | ICD-10-CM | POA: Insufficient documentation

## 2023-11-02 DIAGNOSIS — Z6841 Body Mass Index (BMI) 40.0 and over, adult: Secondary | ICD-10-CM | POA: Insufficient documentation

## 2023-11-02 DIAGNOSIS — I361 Nonrheumatic tricuspid (valve) insufficiency: Secondary | ICD-10-CM | POA: Diagnosis not present

## 2023-11-02 DIAGNOSIS — I10 Essential (primary) hypertension: Secondary | ICD-10-CM

## 2023-11-02 DIAGNOSIS — I110 Hypertensive heart disease with heart failure: Secondary | ICD-10-CM | POA: Diagnosis not present

## 2023-11-02 DIAGNOSIS — R21 Rash and other nonspecific skin eruption: Secondary | ICD-10-CM | POA: Diagnosis not present

## 2023-11-02 MED ORDER — LISINOPRIL 40 MG PO TABS
40.0000 mg | ORAL_TABLET | Freq: Every day | ORAL | 3 refills | Status: AC
Start: 2023-11-02 — End: 2024-04-13

## 2023-11-02 NOTE — Progress Notes (Signed)
 [facility]  NOTE   Referring Physician: Emaline Handsome, MD Primary Care: Emaline Handsome, MD Primary Cardiologist: None   HPI:   Jaclyn Klein is a 58 y/o woman with morbid obesity, HTN referred by Dr. Terry Ficks for further evaluation of pulmonary HTN.   I saw her in 4/24 for the first visit. Denied h/o known cardiac disease prior to recent diagnosis   Has been overweight for a long time. Max weight 307. In 2021 lost 97 pounds with diet. Regained 20 pounds. Was on Phen-Phen for several years. Has been told she snores heavily. Not tested for OSA.    Began to feel SOB in 12/23. Got progressively worse to where she was SOB with any activity. + LE edema.     Echo 2/24 EF 45% RV mod reduced Mod TR. Then underwent L/RHC om 09/14/22 at [facility]   No significant CAD. RA 20 RV 94/23 PA 98/38 (59)  PCWP 32 (LVEDP 24)  AO sat 91% PA sat 60% Fick 4.3/2.1  PVR 6.3 PaPi 3.55  Echo 2/25 EF 60-65% RV severely reduced RVSP 85 Mild-mod TR  RHC 3/25 which was c/w severe PAH w/ evidence of cor pulmonale (see below).   Here for f/u with her daughter. Recenly had rash all over her body. Has seen PCP. Referred to Derm. Can do ADLs but has to go slow. Mild edema. No syncope or presyncope. Only taking 1-2x/week   Sleep study AHI 38 O2 sats down to 63% (follows with Dr. Micael Adas) - pending sleep titration study    RHC Findings 09/27/23:   RA = 15 RV = 92/17 PA = 94/36 (56) PCW = 12 Fick cardiac output/index = 4.5/2.3 Thermo CO/CI = 3.6/1.8 PVR = 12.3 WU (Fick) 15.4 (TD) Ao sat = 92% PA sat = 63%, 66% PAPi = 3.5    Current Outpatient Medications  Medication Instructions   ALPRAZolam (XANAX) 0.5 mg, 2 times daily PRN   atorvastatin  (LIPITOR) 80 mg, Daily   carvedilol  (COREG ) 6.25 MG tablet TAKE 2 TABLETS BY MOUTH TWICE DAILY WITH A MEAL   dapagliflozin  propanediol (FARXIGA ) 10 mg, Oral, Daily before breakfast   diphenhydramine-acetaminophen  (TYLENOL  PM) 25-500 MG TABS tablet 2 tablets, Daily  at bedtime   docusate sodium (COLACE) 100 mg, Daily at bedtime   esomeprazole (NEXIUM) 40 mg, Daily   furosemide  (LASIX ) 40 mg, Oral, Daily   ibuprofen  (ADVIL ) 800 mg, Every 6 hours PRN   levothyroxine  (SYNTHROID ) 100 mcg, Daily before breakfast   lisinopril  (ZESTRIL ) 20 mg, Oral, Daily   spironolactone  (ALDACTONE ) 25 MG tablet Take 1/2 (one-half) tablet by mouth once daily   Vitamin D (Ergocalciferol) (DRISDOL) 50,000 Units, Every 7 days       Allergies  No Known Allergies       Social History         Socioeconomic History   Marital status: Married      Spouse name: Not on file   Number of children: Not on file   Years of education: Not on file   Highest education level: Not on file  Occupational History   Not on file  Tobacco Use   Smoking status: Never   Smokeless tobacco: Never  Vaping Use   Vaping status: Never Used  Substance and Sexual Activity   Alcohol use: Yes      Alcohol/week: 14.0 standard drinks of alcohol      Types: 14 Glasses of wine per week      Comment: 2 glasses to  bottle a day   Drug use: Never   Sexual activity: Yes  Other Topics Concern   Not on file  Social History Narrative   Not on file    Social Drivers of Health        Financial Resource Strain: Low Risk  (07/29/2023)    Received from [facility]    Overall Financial Resource Strain (CARDIA)     Difficulty of Paying Living Expenses: Not hard at all  Food Insecurity: No Food Insecurity (07/29/2023)    Received from [facility]    Hunger Vital Sign     Worried About Running Out of Food in the Last Year: Never true     Ran Out of Food in the Last Year: Never true  Transportation Needs: No Transportation Needs (07/29/2023)    Received from [facility] - Transportation     Lack of Transportation (Medical): No     Lack of Transportation (Non-Medical): No  Physical Activity: Not on file  Stress: No Stress Concern Present (09/14/2022)    Received from [facility],  [facility] of Occupational Health - Occupational Stress Questionnaire     Feeling of Stress : Only a little  Social Connections: Unknown (11/23/2021)    Received from [facility], [facility]    Social Network     Social Network: Not on file  Intimate Partner Violence: Unknown (10/15/2021)    Received from [facility], [facility]    HITS     Physically Hurt: Not on file     Insult or Talk Down To: Not on file     Threaten Physical Harm: Not on file     Scream or Curse: Not on file             Family History  Problem Relation Age of Onset   Healthy Mother     Healthy Father            Vitals:   11/02/23 1550  BP: (!) 149/92  Pulse: 61  SpO2: 97%    Filed Weights   11/02/23 1550  Weight: 229 lb (103.9 kg)      PHYSICAL EXAM: General:  Well appearing. No resp difficulty HEENT: normal Neck: supple. no JVD. Carotids 2+ bilat; no bruits. No lymphadenopathy or thryomegaly appreciated. Cor: Regular rate & rhythm.2/6 TR Lungs: clear Abdomen: obese soft, nontender, nondistended. No hepatosplenomegaly. No bruits or masses. Good bowel sounds. Extremities: no cyanosis, clubbing, edema + rash Neuro: alert & orientedx3, cranial nerves grossly intact. moves all 4 extremities w/o difficulty. Affect pleasant  ASSESSMENT & PLAN:   1. Pulmonary HTN - Echo 2/24 EF 45% RV mod reduced Mod TR.  - R/L cath 3/24 No significant CAD.RA 20 PA 98/38 (59) PCWP 32 (LVEDP 24) Fick 4.3/2.1 PVR 6.3 PaPi 3.5 c/w mixed pre-and post-capillary HTN  - Echo 2/25 EF 60-65% RV severely reduced RVSP 85 Mild-mod TR - Repeat RHC 3/25 RA 15, PAP 94/36, PCW 12, FICK 4.5/23. TD 3.6/1.8, PVR 12.3 WU, PAPi 3.5  - Suspect likely multifactorial WHO Group I =/- II/III - Auto-immune serology (negative). - Has seen Pulmonary ([facility]).  - Will hold off on VQ with low suspicion for CTEPH - Improved NYHA II - Volume ok - Will start sildeanfil and assess response. If tolerates will  add Opsumit soon - Sleep study AHI 38 O2 sats down to 63% (follows with Dr. Micael Adas) - pending sleep titration study. They will contact  Dr. Charl Concha office - Needs PFTs with [facility]CO. Will schedule  2. Chronic diastolic HF  - Echo 2/24 EF 45% (suspect EF underestimated due to RV compression and abnormal septal movement) - Echo 3/25 EF 60-65% - Improved with diuresis but remains NYHA II-III - Volume status looks good - Continue lisinopril  20 bid - Continue spiro 12.5 - Continue carvedilol  6.25 bid - can decrease as needed.  - Continue Farxiga   3. Rash  Have reviewed with Derm. Will need biopsy. PCP referring to local Derm   4. Morbid obesity -Body mass index is 40.57 kg/m. - Will need GLP1-RA   5. OSA, severe - Sleep study AHI 38 O2 sats down to 63% (follows with Dr. Micael Adas) - pending sleep titration study   6. HTN - consider switch of lisinopril  to Entresto at next visit  I spent a total of 41 minutes today: 1) reviewing the patient's medical records including previous charts, labs and recent notes from other providers; 2) examining the patient and counseling them on their medical issues/explaining the plan of care; 3) adjusting meds as needed and 4) ordering lab work or other needed tests.     Jules Oar, MD

## 2023-11-02 NOTE — Patient Instructions (Signed)
 Medication Changes:  INCREASE LISINOPRIL  40 MG ONCE DAILY   Testing/Procedures:  SOMEONE WILL CALL YOU TO SCHEDULE YOUR PULMONARY FUNCTION TEST.   Follow-Up in: 1 MONTH WITH DR. Julane Ny.  At the [facility], you and your health needs are our priority. We have a designated team specialized in the treatment of Heart Failure. This Care Team includes your primary Heart Failure Specialized Cardiologist (physician), Advanced Practice Providers (APPs- Physician Assistants and Nurse Practitioners), and Pharmacist who all work together to provide you with the care you need, when you need it.   You may see any of the following providers on your designated Care Team at your next follow up:  Dr. Jules Oar Dr. Peder Bourdon Dr. Alwin Baars Dr. Judyth Nunnery Shawnee Dellen, FNP Bevely Brush, RPH-CPP  Please be sure to bring in all your medications bottles to every appointment.   Need to Contact Us :  If you have any questions or concerns before your next appointment please send us  a message through [facility] or call our office at [phone].    TO LEAVE A MESSAGE FOR THE NURSE SELECT OPTION 2, PLEASE LEAVE A MESSAGE INCLUDING: YOUR NAME DATE OF BIRTH CALL BACK NUMBER REASON FOR CALL**this is important as we prioritize the call backs  YOU WILL RECEIVE A CALL BACK THE SAME DAY AS LONG AS YOU CALL BEFORE 4:00 PM

## 2023-11-03 ENCOUNTER — Telehealth: Payer: Self-pay | Admitting: Pharmacist

## 2023-11-03 ENCOUNTER — Encounter: Payer: Self-pay | Admitting: Internal Medicine

## 2023-11-03 ENCOUNTER — Encounter: Payer: Self-pay | Admitting: Pulmonary Disease

## 2023-11-03 NOTE — Telephone Encounter (Signed)
 Prior authorization submitted via fax for [facility].

## 2023-11-03 NOTE — Telephone Encounter (Signed)
 Please advise on pt email     Good morning,    It has been almost a year since last seen in your office. The initial hold up was insurance, but that now has been taken care of. Most of the tests that were required have been done. Please review mychart! The last test is the Pulminary Fuction Test and Dr. Julane Ny has ordered this test. Do we need to schedule a follow up visit, or wait on the test results and go from there? There is a follow up already scheduled with Dr. Bensimhon in about a month.    The CPAP results are also not good. Servere sleep apnea- Oxygen dropping as low as 63. Presures in heart are still in 90's. Please review chart!      Thank you!

## 2023-11-08 NOTE — Telephone Encounter (Signed)
 Daughter is calling back to check on pt's order

## 2023-11-08 NOTE — Telephone Encounter (Signed)
 Prior authorization for Mounjaro was denied. An appeal has been faxed to [phone] per [employer] representative's instructions.

## 2023-11-12 NOTE — Telephone Encounter (Signed)
**Note De-Identified Jenise Iannelli Obfuscation** Per letter received through the [facility] provider portal this CPAP Titration has been approved from 11/22/2023 until 02/20/2024. Authorization #: [id number]  I have transferred the order to the sleep lab so they can contact the pt to schedule the test.  I called the pt and she gave me verbal permission to s/w her daughter, Lawton Price concerning her care.  I advised Shiona that [facility] has approved coverage of the pts CPAP Titration and that I have transferred the order to the sleep lab and that they will be contacting the pt to schedule the test. I did provide her with the Sleep Labs phone number so she can call them to schedule the test if she has not heard from them in a week or two.  Shiona verbalized understanding and thanked me for my call.

## 2023-11-15 ENCOUNTER — Telehealth: Payer: Self-pay | Admitting: Internal Medicine

## 2023-11-15 ENCOUNTER — Other Ambulatory Visit: Payer: Self-pay

## 2023-11-15 MED ORDER — CARVEDILOL 6.25 MG PO TABS: 6.2500 mg | ORAL_TABLET | Freq: Two times a day (BID) | ORAL | 1 refills | Status: DC

## 2023-11-15 NOTE — Telephone Encounter (Signed)
Carvedilol refill sent per pt request

## 2023-11-16 ENCOUNTER — Telehealth: Payer: Self-pay

## 2023-11-16 NOTE — Telephone Encounter (Signed)
 Pt's daughter called and stated that they have not heard anything about the pulmonary function test the pt is supposed to have. Called [facility] and they are booked with no availability. Left voicemail for [facility] to potentially schedule the pt at [facility] which is preferable to the pt.

## 2023-11-16 NOTE — Telephone Encounter (Signed)
 Spoke with pt daughter and confirmed appointment time, date and location with her for the pt's pulmonary function test at [facility].

## 2023-11-23 ENCOUNTER — Telehealth: Payer: Self-pay

## 2023-11-23 NOTE — Telephone Encounter (Signed)
 Pt daughter had called concerned that her mother's medication dose had changed. Notified daughter that her meds changed at her last appointment. Daughter stated that her mothers bp had been elevated and that she was concerned that the current doses were not adequate. Verified that the pt had been taking medications as prescribed. Pt had not. She had been out of her medication for a week or more. Advised pt to take medication as prescribed and to call back with blood pressure readings. Daughter called back with readings within normal range with pt on current med list. Pt blood pressures run from 110's-130s systolic with diastolics mostly in 80's. Advised that pt continue prescribed medication course until next appointment.

## 2023-12-09 ENCOUNTER — Ambulatory Visit (HOSPITAL_COMMUNITY)
Admission: RE | Admit: 2023-12-09 | Discharge: 2023-12-09 | Disposition: A | Discharge status: Home / Self Care | Source: Ambulatory Visit | Attending: Internal Medicine | Admitting: Internal Medicine

## 2023-12-09 DIAGNOSIS — I5022 Chronic systolic (congestive) heart failure: Secondary | ICD-10-CM | POA: Diagnosis present

## 2023-12-09 LAB — PULMONARY FUNCTION TEST
DL/VA % pred: 111 %
DL/VA: 4.71 ml/min/mmHg/L
DLCO unc % pred: 93 %
DLCO unc: 19.1 ml/min/mmHg
FEF 25-75 Post: 2.08 L/s
FEF 25-75 Pre: 1.4 L/s
FEF2575-%Change-Post: 48 %
FEF2575-%Pred-Post: 83 %
FEF2575-%Pred-Pre: 56 %
FEV1-%Change-Post: 23 %
FEV1-%Pred-Post: 81 %
FEV1-%Pred-Pre: 65 %
FEV1-Post: 2.15 L
FEV1-Pre: 1.74 L
FEV1FVC-%Change-Post: 18 %
FEV1FVC-%Pred-Pre: 81 %
FEV6-%Change-Post: 6 %
FEV6-%Pred-Post: 85 %
FEV6-%Pred-Pre: 80 %
FEV6-Post: 2.81 L
FEV6-Pre: 2.64 L
FEV6FVC-%Change-Post: 0 %
FEV6FVC-%Pred-Post: 102 %
FEV6FVC-%Pred-Pre: 103 %
FVC-%Change-Post: 4 %
FVC-%Pred-Post: 83 %
FVC-%Pred-Pre: 80 %
FVC-Post: 2.83 L
FVC-Pre: 2.71 L
Post FEV1/FVC ratio: 76 %
Post FEV6/FVC ratio: 99 %
Pre FEV1/FVC ratio: 64 %
Pre FEV6/FVC Ratio: 100 %
RV % pred: 89 %
RV: 1.73 L
TLC % pred: 90 %
TLC: 4.56 L

## 2023-12-09 MED ORDER — ALBUTEROL SULFATE (2.5 MG/3ML) 0.083% IN NEBU
2.5000 mg | INHALATION_SOLUTION | Freq: Once | RESPIRATORY_TRACT | Status: AC
  Administered 2023-12-09: 2.5 mg via RESPIRATORY_TRACT

## 2023-12-10 ENCOUNTER — Other Ambulatory Visit: Payer: Self-pay

## 2023-12-10 ENCOUNTER — Ambulatory Visit (HOSPITAL_BASED_OUTPATIENT_CLINIC_OR_DEPARTMENT_OTHER): Attending: Cardiology | Admitting: Cardiology

## 2023-12-10 VITALS — Ht 64.0 in | Wt 229.0 lb

## 2023-12-10 DIAGNOSIS — I158 Other secondary hypertension: Secondary | ICD-10-CM

## 2023-12-10 DIAGNOSIS — I5022 Chronic systolic (congestive) heart failure: Secondary | ICD-10-CM

## 2023-12-10 DIAGNOSIS — R0683 Snoring: Secondary | ICD-10-CM

## 2023-12-10 DIAGNOSIS — I272 Pulmonary hypertension, unspecified: Secondary | ICD-10-CM

## 2023-12-10 DIAGNOSIS — G4733 Obstructive sleep apnea (adult) (pediatric): Secondary | ICD-10-CM

## 2023-12-12 NOTE — Procedures (Signed)
    [facility] [address] [place], [place] [address] Tel: [phone]   Fax: [phone]  Titration Interpretation  Patient Name:  Jaclyn Klein, Jaclyn Klein Date:  12/10/2023 Referring Physician:  Gaylyn Keas MD  Indications for Polysomnography The patient is a 58 year-old Female who is 5\' 4"  and weighs 229.0 lbs. Her BMI equals 39.6.  A full night titration treatment study was performed.  Medication  No Data.   Polysomnogram Data A full night polysomnogram recorded the standard physiologic parameters including EEG, EOG, EMG, EKG, nasal and oral airflow.  Respiratory parameters of chest and abdominal movements were recorded with Respiratory Inductance Plethysmography belts.  Oxygen saturation was recorded by pulse oximetry.   Sleep Architecture The total recording time of the polysomnogram was 375.2 minutes.  The total sleep time was 292.5 minutes.  The patient spent 0.3% of total sleep time in Stage N1, 85.5% in Stage N2, 0.2% in Stages N3, and 14.0% in REM.  Sleep latency was 66.0 minutes.  REM latency was 78.5 minutes.  Sleep Efficiency was 78.0%.  Wake after Sleep Onset time was 17.0 minutes.  Titration Summary The patient was titrated at pressures ranging from 5 cm/H20 up to 11 cm/H20. The last pressure used in the study was 11 cm/H20.  Respiratory Events The polysomnogram revealed a presence of - obstructive, 0 central, and 0 mixed apneas resulting in an Apnea index of 0 events per hour.  There were 7 hypopneas (>=3% desaturation and/or arousal) resulting in an Apnea\Hypopnea Index (AHI >=3% desaturation and/or arousal) of 1.4 events per hour.  There were 3 hypopneas (>=4% desaturation) resulting in an Apnea\Hypopnea Index (AHI >=4% desaturation) of 0.6 events per hour.  There were 0 Respiratory Effort Related Arousals resulting in a RERA index of - events per hour. The Respiratory Disturbance Index is 1.4 events per hour.  The snore index was -  events per hour.  Mean oxygen saturation was 89.4%.  The lowest oxygen saturation during sleep was 84.0%.  Time spent <=88% oxygen saturation was 112.3 minutes (29.9%).  Limb Activity There were 0 limb movements recorded.    Cardiac Summary The average pulse rate was 61.7 bpm.  The minimum pulse rate was 53.0 bpm while the maximum pulse rate was 87.0 bpm.  Cardiac rhythm was Normal.  Diagnosis:  Obstructive Sleep Apnea  Recommendations: Recommend a trial of ResMed CPAP at 11cm H2O with heated humidity and medium ResMed Airfit F20 full face mask. The patient should be counseled in good sleep hygiene. Encourage patient to avoid driving when sleepy. The patient should avoid sleeping in the supine position.  Followup in Sleep Medicine Clinic in 6 weeks.  This study was personally reviewed and electronically signed by: Gaylyn Keas MD Accredited Board Certified in Sleep Medicine Date/Time: 12/12/2023 8:24PM

## 2023-12-13 ENCOUNTER — Encounter: Admitting: Internal Medicine

## 2023-12-15 ENCOUNTER — Telehealth: Payer: Self-pay | Admitting: Internal Medicine

## 2023-12-15 NOTE — Telephone Encounter (Signed)
 Called to confirm/remind patient of their appointment at the [facility] on 12/16/23.   Appointment:   [x] Confirmed  [] Left mess   [] No answer/No voice mail  [] VM Full/unable to leave message  [] Phone not in service  Patient reminded to bring all medications and/or complete list.  Confirmed patient has transportation. Gave directions, instructed to utilize valet parking.

## 2023-12-16 ENCOUNTER — Encounter: Payer: Self-pay | Admitting: Internal Medicine

## 2023-12-16 ENCOUNTER — Encounter: Admitting: Internal Medicine

## 2023-12-16 ENCOUNTER — Telehealth: Payer: Self-pay | Admitting: Pharmacist

## 2023-12-16 ENCOUNTER — Other Ambulatory Visit (HOSPITAL_COMMUNITY): Payer: Self-pay

## 2023-12-16 ENCOUNTER — Ambulatory Visit: Attending: Internal Medicine | Admitting: Internal Medicine

## 2023-12-16 VITALS — BP 122/83 | HR 77 | Wt 217.2 lb

## 2023-12-16 DIAGNOSIS — I272 Pulmonary hypertension, unspecified: Secondary | ICD-10-CM | POA: Diagnosis not present

## 2023-12-16 DIAGNOSIS — Z6837 Body mass index (BMI) 37.0-37.9, adult: Secondary | ICD-10-CM | POA: Insufficient documentation

## 2023-12-16 DIAGNOSIS — Z7984 Long term (current) use of oral hypoglycemic drugs: Secondary | ICD-10-CM | POA: Diagnosis not present

## 2023-12-16 DIAGNOSIS — I110 Hypertensive heart disease with heart failure: Secondary | ICD-10-CM | POA: Insufficient documentation

## 2023-12-16 DIAGNOSIS — Z79899 Other long term (current) drug therapy: Secondary | ICD-10-CM | POA: Insufficient documentation

## 2023-12-16 DIAGNOSIS — I5032 Chronic diastolic (congestive) heart failure: Secondary | ICD-10-CM | POA: Diagnosis not present

## 2023-12-16 DIAGNOSIS — L409 Psoriasis, unspecified: Secondary | ICD-10-CM | POA: Insufficient documentation

## 2023-12-16 DIAGNOSIS — R0683 Snoring: Secondary | ICD-10-CM | POA: Insufficient documentation

## 2023-12-16 DIAGNOSIS — G4733 Obstructive sleep apnea (adult) (pediatric): Secondary | ICD-10-CM | POA: Insufficient documentation

## 2023-12-16 DIAGNOSIS — I10 Essential (primary) hypertension: Secondary | ICD-10-CM

## 2023-12-16 MED ORDER — EMPAGLIFLOZIN 10 MG PO TABS: 10.0000 mg | ORAL_TABLET | Freq: Every day | ORAL | 11 refills | Status: AC

## 2023-12-16 NOTE — Addendum Note (Signed)
 Addended by: Autry Legions A on: 12/16/2023 04:07 PM   Modules accepted: Orders

## 2023-12-16 NOTE — Telephone Encounter (Signed)
 Asked to re-evaluate eligibility for GLP-1RA. Previously denied for Zepbound and does not meet criteria for Mounjaro without an A1c of 6.5 or greater. A1c in 07/2023 was 6.1. Patient left before this could be discussed. She reportedly told the nursing staff "I don't know if I want this, I'm going to wait until next visit." Prior authorization for Zepbound was denied along with the appeal. Spoke with insurance via phone and they report that neither branded Ozempic or Mounjaro would be covered without a diagnosis of diabetes. PA for [facility] submitted ([id number]). Insurance now prefers [place]. Jardiance sent in. Patient still has 6 months of Farxiga  and will complete this before picking up Jardiance.

## 2023-12-16 NOTE — Progress Notes (Signed)
 [facility]  NOTE   Referring Physician: Emaline Handsome, MD Primary Care: Emaline Handsome, MD Primary Cardiologist: None   HPI:   Jaclyn Klein is a 58 y/o woman with morbid obesity, HTN referred by Dr. Terry Ficks for further evaluation of pulmonary HTN.   I saw her in 4/24 for the first visit. Denied h/o known cardiac disease prior to recent diagnosis   Has been overweight for a long time. Max weight 307. In 2021 lost 97 pounds with diet. Regained 20 pounds. Was on Phen-Phen for several years. Has been told she snores heavily. Not tested for OSA.    Began to feel SOB in 12/23. Got progressively worse to where she was SOB with any activity. + LE edema.     Echo 2/24 EF 45% RV mod reduced Mod TR. Then underwent L/RHC om 09/14/22 at [facility]   No significant CAD. RA 20 RV 94/23 PA 98/38 (59)  PCWP 32 (LVEDP 24)  AO sat 91% PA sat 60% Fick 4.3/2.1  PVR 6.3 PaPi 3.55  Echo 2/25 EF 60-65% RV severely reduced RVSP 85 Mild-mod TR  RHC 3/25 which was c/w severe PAH w/ evidence of cor pulmonale (see below).   Sleep study AHI 38 O2 sats down to 63% (follows with Dr. Micael Adas) - had sleep study titration last week. Has not rec'd device yet   Here for f/u with her daughter. Has lost 12 pounds with diet. Has not gotten approval for GLP1RA yet.   PFTs 12/09/23 FEV1 1.74 (65%) FVC 2.71 (80%) FEV1/FVC 64% DLCO 93%   Post BD FEV1 2.15 Ratio 81%  RHC Findings 09/27/23:   RA = 15 RV = 92/17 PA = 94/36 (56) PCW = 12 Fick cardiac output/index = 4.5/2.3 Thermo CO/CI = 3.6/1.8 PVR = 12.3 WU (Fick) 15.4 (TD) Ao sat = 92% PA sat = 63%, 66% PAPi = 3.5    Current Outpatient Medications  Medication Instructions   ALPRAZolam (XANAX) 0.5 mg, 2 times daily PRN   atorvastatin  (LIPITOR) 80 mg, Daily   carvedilol  (COREG ) 6.25 mg, Oral, 2 times daily with meals   dapagliflozin  propanediol (FARXIGA ) 10 mg, Oral, Daily before breakfast   diphenhydramine-acetaminophen  (TYLENOL  PM) 25-500 MG TABS  tablet 2 tablets, Daily at bedtime   docusate sodium (COLACE) 100 mg, Daily at bedtime   esomeprazole (NEXIUM) 40 mg, Daily   furosemide  (LASIX ) 40 mg, Oral, Daily   gabapentin (NEURONTIN) 100 mg, 3 times daily PRN   ibuprofen  (ADVIL ) 800 mg, Every 6 hours PRN   levothyroxine  (SYNTHROID ) 100 mcg, Daily before breakfast   lisinopril  (ZESTRIL ) 40 mg, Oral, Daily   spironolactone  (ALDACTONE ) 25 MG tablet Take 1/2 (one-half) tablet by mouth once daily   Vitamin D (Ergocalciferol) (DRISDOL) 50,000 Units, Every 7 days       Allergies  No Known Allergies       Social History         Socioeconomic History   Marital status: Married      Spouse name: Not on file   Number of children: Not on file   Years of education: Not on file   Highest education level: Not on file  Occupational History   Not on file  Tobacco Use   Smoking status: Never   Smokeless tobacco: Never  Vaping Use   Vaping status: Never Used  Substance and Sexual Activity   Alcohol use: Yes      Alcohol/week: 14.0 standard drinks of alcohol      Types:  14 Glasses of wine per week      Comment: 2 glasses to bottle a day   Drug use: Never   Sexual activity: Yes  Other Topics Concern   Not on file  Social History Narrative   Not on file    Social Drivers of Health        Financial Resource Strain: Low Risk  (07/29/2023)    Received from [facility]    Overall Financial Resource Strain (CARDIA)     Difficulty of Paying Living Expenses: Not hard at all  Food Insecurity: No Food Insecurity (07/29/2023)    Received from [facility]    Hunger Vital Sign     Worried About Running Out of Food in the Last Year: Never true     Ran Out of Food in the Last Year: Never true  Transportation Needs: No Transportation Needs (07/29/2023)    Received from [facility] - Transportation     Lack of Transportation (Medical): No     Lack of Transportation (Non-Medical): No  Physical Activity: Not on file   Stress: No Stress Concern Present (09/14/2022)    Received from [facility], [facility] - Occupational Stress Questionnaire     Feeling of Stress : Only a little  Social Connections: Unknown (11/23/2021)    Received from [facility], [facility]    Social Network     Social Network: Not on file  Intimate Partner Violence: Unknown (10/15/2021)    Received from [facility], [facility]    HITS     Physically Hurt: Not on file     Insult or Talk Down To: Not on file     Threaten Physical Harm: Not on file     Scream or Curse: Not on file             Family History  Problem Relation Age of Onset   Healthy Mother     Healthy Father            Vitals:   12/16/23 1414  BP: 122/83  Pulse: 77  SpO2: 96%    Wt Readings from Last 3 Encounters:  12/16/23 217 lb 3.2 oz (98.5 kg)  12/10/23 229 lb (103.9 kg)  11/02/23 229 lb (103.9 kg)      PHYSICAL EXAM: General:  Well appearing. No resp difficulty HEENT: normal Neck: supple. no JVD 6 . Carotids 2+ bilat; no bruits. No lymphadenopathy or thryomegaly appreciated. Cor: PMI nondisplaced. Regular rate & rhythm. No rubs, gallops or murmurs. Lungs: clear Abdomen: obese soft, nontender, nondistended. No hepatosplenomegaly. No bruits or masses. Good bowel sounds. Extremities: no cyanosis, clubbing, edema Neuro: alert & orientedx3, cranial nerves grossly intact. moves all 4 extremities w/o difficulty. Affect pleasant   ASSESSMENT & PLAN:   1. Pulmonary HTN - Echo 2/24 EF 45% RV mod reduced Mod TR.  - R/L cath 3/24 No significant CAD.RA 20 PA 98/38 (59) PCWP 32 (LVEDP 24) Fick 4.3/2.1 PVR 6.3 PaPi 3.5 c/w mixed pre-and post-capillary HTN  - Echo 2/25 EF 60-65% RV severely reduced RVSP 85 Mild-mod TR - Repeat RHC 3/25 RA 15, PAP 94/36, PCW 12, FICK 4.5/23. TD 3.6/1.8, PVR 12.3 WU, PAPi 3.5  - Suspect likely multifactorial WHO Group I +/- II, III - Auto-immune serology  (negative). - Has seen Pulmonary ([place]).  - Will hold off on VQ with low suspicion for CTEPH - Improved NYHA II - Volume  looks good - Continue sildeanfil and assess response. If tolerates will add Opsumit soon - Sleep study AHI 38 O2 sats down to 63% (follows with Dr. Micael Adas) - pending sleep titration study. They will contact Dr. Charl Concha office - PFTs 5/25 with normal DLCO and evidence of obstruction with BD responsiveness. Will refer back to Dr. Pinky Bright to consider inhalers. Flattened inspiratory curve raises suspicion for vocal cord dysfunction  - Will repeat RHC once she has had further weight loss and is on CPAP x 2 months  2. Chronic diastolic HF  - Echo 2/24 EF 45% (suspect EF underestimated due to RV compression and abnormal septal movement) - Echo 3/25 EF 60-65% - Improving NYHA II-early III - Volume status looks good - Continue lisinopril  20 bid - Continue spiro 12.5 - Continue carvedilol  6.25 bid - can decrease as needed.  - Continue Farxiga   3. Rash - found to have psoriasis. Derm following   4. Morbid obesity -Body mass index is 37.28 kg/m. - has lost 12 pounds with diet - continue to work on getting GLP1RA  5. OSA, severe - Sleep study AHI 38 O2 sats down to 63% (follows with Dr. Micael Adas) -  - about to start CPAP  6. HTN - Blood pressure well controlled. Continue current regimen.  Jules Oar, MD

## 2023-12-17 ENCOUNTER — Telehealth: Payer: Self-pay

## 2023-12-17 NOTE — Telephone Encounter (Signed)
 Patient returning call from Ms. Margie to get her scheduled with Dr Marygrace Snellen . There are no appointments available till July 2. Did get patient scheduled for July 2nd at 9 am . A regular office visit . Hope that's ok

## 2023-12-17 NOTE — Telephone Encounter (Signed)
 Left VM with callback number for patient to receive information regarding CPAP device and supplies being ordered.

## 2023-12-17 NOTE — Telephone Encounter (Signed)
-----   Message from Jules Oar sent at 12/16/2023  6:51 PM EDT ----- Regarding: RE: PFTs Thanks Matt! ----- Message ----- From: Guerry Leek, MD Sent: 12/16/2023   3:12 PM EDT To: Mardell Shade, MD; Janas Meadows, CMA Subject: RE: PFTs                                       Yes - happy to.  Jeralynn Vaquera, can we schedule her with me next available, ok to use acute slot or hospital f/u - thanks! ----- Message ----- From: Mardell Shade, MD Sent: 12/16/2023   2:59 PM EDT To: Guerry Leek, MD Subject: PFTs                                           Hey Matt - PFTs (surprisingly) showed obstruction and not restriction which was BD responsive. Would you mind seeing her back for inhalers?   Thanks -dan

## 2023-12-17 NOTE — Telephone Encounter (Signed)
-----   Message from Gaylyn Keas sent at 12/12/2023  8:27 PM EDT ----- Please let patient know that they had a successful PAP titration and let DME know that orders are in EPIC.  Please set up 6 week OV with me.

## 2023-12-17 NOTE — Telephone Encounter (Signed)
 Lm for patient to schedule appt

## 2023-12-20 ENCOUNTER — Other Ambulatory Visit: Payer: Self-pay

## 2023-12-20 NOTE — Telephone Encounter (Signed)
 Appt scheduled 01/12/2024. Pt is aware and voiced her understanding.  Nothing further needed.

## 2023-12-21 NOTE — Telephone Encounter (Signed)
 Pt returning your call

## 2023-12-21 NOTE — Telephone Encounter (Signed)
 Pt's daughter Lawton Price returning call, requesting cb

## 2023-12-23 NOTE — Telephone Encounter (Signed)
 Left VM with callback number for patient to receive CPAP recommendations. 12/23/23

## 2023-12-24 NOTE — Telephone Encounter (Signed)
 Pt returning the nurse's call. Please advise

## 2023-12-27 NOTE — Telephone Encounter (Signed)
 [facility] prior authorization and appeal have both been denied. The patient will not approve of pursuing a peer to peer at this time and prefers to focus on diet and exercise.

## 2024-01-03 NOTE — Telephone Encounter (Signed)
 Notified patient of successful titration, order for new CPAP device and supplies has been sent to [facility] today. All questions were answered and patient verbalized understanding.

## 2024-01-05 ENCOUNTER — Other Ambulatory Visit (HOSPITAL_COMMUNITY): Payer: Self-pay | Admitting: Internal Medicine

## 2024-01-07 NOTE — Telephone Encounter (Signed)
 Pt requesting a c/b in regards to the CPAP if she doesn't answer call her daughter please.

## 2024-01-12 ENCOUNTER — Ambulatory Visit (INDEPENDENT_AMBULATORY_CARE_PROVIDER_SITE_OTHER): Admitting: Pulmonary Disease

## 2024-01-12 ENCOUNTER — Encounter: Payer: Self-pay | Admitting: Pulmonary Disease

## 2024-01-12 VITALS — BP 119/84 | HR 81 | Ht 62.0 in | Wt 214.0 lb

## 2024-01-12 DIAGNOSIS — J45909 Unspecified asthma, uncomplicated: Secondary | ICD-10-CM

## 2024-01-12 DIAGNOSIS — I272 Pulmonary hypertension, unspecified: Secondary | ICD-10-CM | POA: Diagnosis not present

## 2024-01-12 MED ORDER — ALBUTEROL SULFATE HFA 108 (90 BASE) MCG/ACT IN AERS: 2.0000 | INHALATION_SPRAY | Freq: Four times a day (QID) | RESPIRATORY_TRACT | 6 refills | Status: AC | PRN

## 2024-01-12 NOTE — Patient Instructions (Addendum)
 Nice to see you again  Based on your pulmonary function test I think asthma could be contributing to some symptoms  To better treat this I recommend using albuterol  2 puffs every 4-6 hours as needed  Consider using it prior to activities that yield significant shortness of breath like walking around [place] or shopping etc.  Return to clinic in 6 months or sooner as needed with Dr. Annella

## 2024-01-12 NOTE — Progress Notes (Signed)
 @Patient  ID: Jaclyn Klein, female    DOB: Feb 20, 1966, 58 y.o.   MRN: [id number]  Chief Complaint  Patient presents with   Follow-up    Referring provider: McClanahan, Kyra, NP  HPI:   58 y.o. woman whom we are seeing for evaluation of pulmonary hypertension. Most recent cardiology note x 2 reviewed.    Return at the request of cardiology.  Seen a year ago.  At 58 time financial situation precluded additional workup.  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  Notes indicate sildenafil therapy although it.  She is not prescribed this, she is not taking it.  Possible dictation error.[name]  PFTs performed in interim reveal moderate COPD with significant improvement in FEV1 after bronchodilator and resolution of obstruction, otherwise normal PFTs.  See below for full interpretation.  She has minimal symptoms of dyspnea on exertion.  She states much better with fluid control and diuretic therapy, CHF treatment.  We discussed the role of inhalers particular with the results of her pulmonary function testing most consistent with asthma.  HPI at initial visit: Patient was at usual state of health.  Even through the holidays of 2023.  Early 2024 was okay.  All the sudden relatively rapidly February 2024 developed onset of rapid weight gain and lower extremity edema.  Difficulty breathing.  Went to the hospital.  Found to be volume overloaded.  Was receiving diuresis.  Underwent right heart catheterization.  Probably prior to euvolemia.  EF in the hospital position was revealed to be low at 58%.  Reported measurements are RA mean pressure 20, mean PA pressure 59, LVEDP was 24 (wedge higher in the mid 30s) cardiac output 4.3, index 2.1, via Fick, PVR 5.8 Woods units.  She continued aggressive diuresis.  She reports lost a lot of water weight.  Sent home.  Has follow-up with advanced heart failure clinic.  Is on medications for heart failure etc.   No pulmonary vasodilators based on characteristics of hemodynamic measurements while hospitalized.  Overall she is much improved.  Dyspnea much improved with diuresis and other medications for her heart failure.  We discussed at length and in great detail the etiologies, WHO groups of pulmonary hypertension.  How each 1 is treated differently and the implications of each.  Discussed at length the role and rationale for further workup to further identify causes of her pulmonary hypertension.  Unfortunate, the financial strain for this workup to be quite high in the setting of her lack of insurance.  She notes multiple bills from hospitalization etc.  Discussed I am happy to help in any way, none of this is necessarily urgent although I think the further we understand her disease the better we can treat it now in the future.   Questionaires / Pulmonary Flowsheets:   ACT:      No data to display          MMRC:     No data to display          Epworth:      No data to display          Tests:   FENO:  No results found for: NITRICOXIDE  PFT:    Latest Ref Rng & Units 12/09/2023    8:33 AM  PFT Results  FVC-Pre L 2.71   FVC-Predicted Pre % 80   FVC-Post L 2.83   FVC-Predicted Post % 83   Pre  FEV1/FVC % % 64   Post FEV1/FCV % % 76   FEV1-Pre L 1.74   FEV1-Predicted Pre % 65   FEV1-Post L 2.15   DLCO uncorrected ml/min/mmHg 19.10   DLCO UNC% % 93   DLVA Predicted % 111   TLC L 4.56   TLC % Predicted % 90   RV % Predicted % 89   Personally viewed interpreted a as prebronchodilator spirometry consistent with moderate fixed obstruction, after bronchodilators significant improvement in FEV1 with resolution of fixed obstruction, lung volumes within normal limits, DLCO within normal limits.  WALK:      No data to display          Imaging: Personally reviewed and as per EMR discussion this note Sleep Study Documents Result Date: 12/14/2023 Ordered by an  unspecified provider.   Lab Results: Personally reviewed CBC    Component Value Date/Time   WBC 5.4 09/20/2023 1454   RBC 5.28 (H) 09/20/2023 1454   HGB 14.6 09/27/2023 1339   HCT 43.0 09/27/2023 1339   PLT 119 (L) 09/20/2023 1454   MCV 93.6 09/20/2023 1454   MCH 30.3 09/20/2023 1454   MCHC 32.4 09/20/2023 1454   RDW 14.7 09/20/2023 1454   LYMPHSABS 1.6 06/30/2019 1343   MONOABS 0.7 06/30/2019 1343   EOSABS 0.1 06/30/2019 1343   BASOSABS 0.1 06/30/2019 1343    BMET    Component Value Date/Time   NA 143 09/27/2023 1339   K 3.5 09/27/2023 1339   CL 110 09/20/2023 1454   CO2 23 09/20/2023 1454   GLUCOSE 105 (H) 09/20/2023 1454   BUN 25 (H) 09/20/2023 1454   CREATININE 0.96 09/20/2023 1454   CALCIUM  9.0 09/20/2023 1454   GFRNONAA >60 09/20/2023 1454   GFRAA >60 07/03/2019 0437    BNP No results found for: BNP  ProBNP No results found for: PROBNP  Specialty Problems   None   No Known Allergies   There is no immunization history on file for this patient.  Past Medical History:  Diagnosis Date   Hyperlipidemia    Hypertension     Tobacco History: Social History   Tobacco Use  Smoking Status Never  Smokeless Tobacco Never   Counseling given: Not Answered   Continue to not smoke  Outpatient Encounter Medications as of 01/12/2024  Medication Sig   albuterol  (VENTOLIN  HFA) 108 (90 Base) MCG/ACT inhaler Inhale 2 puffs into the lungs every 6 (six) hours as needed.   ALPRAZolam (XANAX) 0.5 MG tablet Take 0.5 mg by mouth 2 (two) times daily as needed for anxiety.   atorvastatin  (LIPITOR) 80 MG tablet Take 80 mg by mouth daily.   carvedilol  (COREG ) 6.25 MG tablet Take 1 tablet (6.25 mg total) by mouth 2 (two) times daily with a meal.   diphenhydramine-acetaminophen  (TYLENOL  PM) 25-500 MG TABS tablet Take 2 tablets by mouth at bedtime.   docusate sodium (COLACE) 100 MG capsule Take 100 mg by mouth at bedtime.   empagliflozin  (JARDIANCE ) 10 MG TABS  tablet Take 1 tablet (10 mg total) by mouth daily before breakfast.   esomeprazole (NEXIUM) 40 MG capsule Take 40 mg by mouth daily.   furosemide  (LASIX ) 40 MG tablet Take 1 tablet (40 mg total) by mouth daily.   gabapentin (NEURONTIN) 100 MG capsule Take 100 mg by mouth 3 (three) times daily as needed (nerve pain).   ibuprofen  (ADVIL ) 200 MG tablet Take 800 mg by mouth every 6 (six) hours as needed for moderate pain.  levothyroxine  (SYNTHROID ) 100 MCG tablet Take 100 mcg by mouth daily before breakfast.   lisinopril  (ZESTRIL ) 40 MG tablet Take 1 tablet (40 mg total) by mouth daily.   spironolactone  (ALDACTONE ) 25 MG tablet Take 1/2 (one-half) tablet by mouth once daily   Vitamin D, Ergocalciferol, (DRISDOL) 1.25 MG (50000 UNIT) CAPS capsule Take 50,000 Units by mouth every 7 (seven) days.   No facility-administered encounter medications on file as of 01/12/2024.     Review of Systems  Review of Systems  N/a Physical Exam  BP 119/84 (BP Location: Left Arm, Patient Position: Sitting, Cuff Size: Normal)   Pulse 81   Ht 5' 2 (1.575 m)   Wt 214 lb (97.1 kg)   LMP  (LMP Unknown) Comment: takes birth control, hcg 4 on 08/20/2015  SpO2 91%   BMI 39.14 kg/m   Wt Readings from Last 5 Encounters:  01/12/24 214 lb (97.1 kg)  12/16/23 217 lb 3.2 oz (98.5 kg)  12/10/23 229 lb (103.9 kg)  11/02/23 229 lb (103.9 kg)  09/27/23 219 lb (99.3 kg)    BMI Readings from Last 5 Encounters:  01/12/24 39.14 kg/m  12/16/23 37.28 kg/m  12/10/23 39.31 kg/m  11/02/23 40.57 kg/m  09/27/23 38.79 kg/m     Physical Exam General: Sitting in chair, no acute distress Eyes: EOMI, no icterus Neck: Supple Pulmonary: Clear, normal work of breathing Cardiovascular: Regular rate and rhythm, normal S1 and S2, no edema Abdomen: Nondistended, bowel sounds present MSK: No synovitis, no joint effusion Neuro: Normal gait, no weakness Psych: Normal mood, full affect    Assessment & Plan:   Pulmonary  hypertension: Suspect largely driven by group 2 disease given elevated LVEDP of 24, reduced ejection fraction etc.  PVR is elevated which begs a question of either chronic changes related to group 2 or concomitant Group 1 disease.  Given large group 2 disease, caution with pulmonary vasodilators is advised.  Agree with salt control, diuretics, medicines for reduced EF to remodel and improve systolic function.  She has received excellent care from cardiology colleagues.  Symptomatically she is improved.  Additional treatment at the discretion of advanced heart failure team.  Reasonable to perform repeat right heart catheterization after time on OSA treatment, CPAP etc.  Asthma: Based on pulmonary function test with significant bronchodilator response and resolution of fixed obstruction after bronchodilators.  Her dyspnea is markedly improved with diuretic therapy, CHF treatment.  Given relative lack of symptoms albeit subjectively, likely no role for maintenance inhaler at this time.  Recommend albuterol  inhaler as needed.  Can always reevaluate need remains inhaler in the future.  Especially if symptoms worsen.  He finds she is using albuterol  frequently.   Return in about 3 months (around 04/13/2024) for f/u Dr. Annella.     Donnice JONELLE Annella, MD 01/12/2024

## 2024-01-18 NOTE — Telephone Encounter (Signed)
 Pt called back because she has not heard from [facility]. I called [facility] and spoke to [place]. She said she had not received order but if it was in Epic she could pull it. It was in Epic so they pulled it and will be calling the patient. I have made pt aware.

## 2024-02-29 ENCOUNTER — Other Ambulatory Visit: Payer: Self-pay

## 2024-03-14 ENCOUNTER — Other Ambulatory Visit: Payer: Self-pay | Admitting: Internal Medicine

## 2024-04-03 ENCOUNTER — Telehealth: Payer: Self-pay | Admitting: Internal Medicine

## 2024-04-03 ENCOUNTER — Other Ambulatory Visit (HOSPITAL_COMMUNITY): Payer: Self-pay | Admitting: Internal Medicine

## 2024-04-03 NOTE — Telephone Encounter (Signed)
 Called to confirm/remind patient of their appointment at the [facility] on 04/04/24.   Appointment:   [x] Confirmed  [] Left mess   [] No answer/No voice mail  [] VM Full/unable to leave message  [] Phone not in service  Patient reminded to bring all medications and/or complete list.  Confirmed patient has transportation. Gave directions, instructed to utilize valet parking.

## 2024-04-04 ENCOUNTER — Ambulatory Visit: Attending: Internal Medicine | Admitting: Internal Medicine

## 2024-04-04 ENCOUNTER — Telehealth (HOSPITAL_COMMUNITY): Payer: Self-pay | Admitting: Licensed Clinical Social Worker

## 2024-04-04 ENCOUNTER — Encounter: Payer: Self-pay | Admitting: Internal Medicine

## 2024-04-04 VITALS — BP 105/67 | HR 64 | Wt 224.0 lb

## 2024-04-04 DIAGNOSIS — Z6841 Body Mass Index (BMI) 40.0 and over, adult: Secondary | ICD-10-CM | POA: Diagnosis not present

## 2024-04-04 DIAGNOSIS — Z7984 Long term (current) use of oral hypoglycemic drugs: Secondary | ICD-10-CM | POA: Insufficient documentation

## 2024-04-04 DIAGNOSIS — I10 Essential (primary) hypertension: Secondary | ICD-10-CM | POA: Diagnosis not present

## 2024-04-04 DIAGNOSIS — Z79899 Other long term (current) drug therapy: Secondary | ICD-10-CM | POA: Insufficient documentation

## 2024-04-04 DIAGNOSIS — I272 Pulmonary hypertension, unspecified: Secondary | ICD-10-CM | POA: Insufficient documentation

## 2024-04-04 DIAGNOSIS — I5032 Chronic diastolic (congestive) heart failure: Secondary | ICD-10-CM | POA: Diagnosis not present

## 2024-04-04 DIAGNOSIS — G4733 Obstructive sleep apnea (adult) (pediatric): Secondary | ICD-10-CM | POA: Diagnosis not present

## 2024-04-04 DIAGNOSIS — I110 Hypertensive heart disease with heart failure: Secondary | ICD-10-CM | POA: Insufficient documentation

## 2024-04-04 NOTE — Telephone Encounter (Signed)
 H&V Care Navigation CSW Progress Note  Clinical Social Worker consulted to speak with pt regarding disability.  Pt reports she moved to the [place]  from [place] when she was [age]yo and had Green Card at that time but didn't renew it.  States she just got new [employer] and has Forensic scientist status.  Appears to have social security number and has insurance.    CSW directed her to call Social Security to discuss next steps- should be eligible as she has been legal permanent resident for more than 5 years.  No further needs or questions as this time  Jaclyn Bayliss H. Keileigh Vahey, LCSW Clinical Social Worker [facility] Desk#: [phone] Cell#: [phone]

## 2024-04-04 NOTE — H&P (View-Only) (Signed)
 [facility]  NOTE   Referring Physician: Sophronia Ozell BROCKS, MD Primary Care: Sophronia Ozell BROCKS, MD Primary Cardiologist: None   HPI:   Jaclyn Klein is a 58 y/o woman with morbid obesity, HTN referred by Dr. Uvaldo for further evaluation of pulmonary HTN.   I saw her in 4/24 for the first visit. Denied h/o known cardiac disease prior to recent diagnosis   Has been overweight for a long time. Max weight 307. In 2021 lost 97 pounds with diet. Regained 20 pounds. Was on Phen-Phen for several years. Has been told she snores heavily. Not tested for OSA.    Began to feel SOB in 12/23. Got progressively worse to where she was SOB with any activity. + LE edema.     Echo 2/24 EF 45% RV mod reduced Mod TR. Then underwent L/RHC om 09/14/22 at [facility]   No significant CAD. RA 20 RV 94/23 PA 98/38 (59)  PCWP 32 (LVEDP 24)  AO sat 91% PA sat 60% Fick 4.3/2.1  PVR 6.3 PaPi 3.55  Echo 2/25 EF 60-65% RV severely reduced RVSP 85 Mild-mod TR  RHC 3/25 which was c/w severe PAH w/ evidence of cor pulmonale (see below).   Sleep study AHI 38 O2 sats down to 63% (follows with Dr. Shlomo) - had sleep study titration last week. Has not rec'd device yet   PFTs 12/09/23 FEV1 1.74 (65%) FVC 2.71 (80%) FEV1/FVC 64% DLCO 93%   Post BD FEV1 2.15 Ratio 81%  RHC Findings 09/27/23:   RA = 15 RV = 92/17 PA = 94/36 (56) PCW = 12 Fick cardiac output/index = 4.5/2.3 Thermo CO/CI = 3.6/1.8 PVR = 12.3 WU (Fick) 15.4 (TD) Ao sat = 92% PA sat = 63%, 66% PAPi = 3.5   She returns to clinic for f/u today with her daughter. Been on CPAP x 2 months. Hates it. Couldn't get Mounjaro approved. Gained 5-10 pounds from stress eating. Still SOB with mild activity. No edema, orthopnea or PND. Has not been on sildenafil     Current Outpatient Medications  Medication Instructions   albuterol  (VENTOLIN  HFA) 108 (90 Base) MCG/ACT inhaler 2 puffs, Inhalation, Every 6 hours PRN   ALPRAZolam (XANAX) 0.5 mg, 2 times  daily PRN   atorvastatin  (LIPITOR) 80 mg, Daily   busPIRone (BUSPAR) 10 mg, 2 times daily   carvedilol  (COREG ) 6.25 mg, Oral, 2 times daily with meals   cholecalciferol (VITAMIN D3) 5,000 Units, Daily   diphenhydramine-acetaminophen  (TYLENOL  PM) 25-500 MG TABS tablet 2 tablets, Daily at bedtime   docusate sodium (COLACE) 100 mg, Daily at bedtime   empagliflozin  (JARDIANCE ) 10 mg, Oral, Daily before breakfast   esomeprazole (NEXIUM) 40 mg, Daily   furosemide  (LASIX ) 40 mg, Oral, Daily   gabapentin (NEURONTIN) 100 mg, 3 times daily PRN   ibuprofen  (ADVIL ) 800 mg, Every 6 hours PRN   levothyroxine  (SYNTHROID ) 100 mcg, Daily before breakfast   lisinopril  (ZESTRIL ) 40 mg, Oral, Daily   spironolactone  (ALDACTONE ) 25 MG tablet Take 1/2 (one-half) tablet by mouth once daily   traZODone (DESYREL) 25-50 mg, At bedtime PRN   Vitamin D (Ergocalciferol) (DRISDOL) 50,000 Units, Every 7 days       Allergies  No Known Allergies       Social History         Socioeconomic History   Marital status: Married      Spouse name: Not on file   Number of children: Not on file   Years of education: Not  on file   Highest education level: Not on file  Occupational History   Not on file  Tobacco Use   Smoking status: Never   Smokeless tobacco: Never  Vaping Use   Vaping status: Never Used  Substance and Sexual Activity   Alcohol use: Yes      Alcohol/week: 14.0 standard drinks of alcohol      Types: 14 Glasses of wine per week      Comment: 2 glasses to bottle a day   Drug use: Never   Sexual activity: Yes  Other Topics Concern   Not on file  Social History Narrative   Not on file    Social Drivers of Health        Financial Resource Strain: Low Risk  (07/29/2023)    Received from [facility]    Overall Financial Resource Strain (CARDIA)     Difficulty of Paying Living Expenses: Not hard at all  Food Insecurity: No Food Insecurity (07/29/2023)    Received from [facility]    Hunger  Vital Sign     Worried About Running Out of Food in the Last Year: Never true     Ran Out of Food in the Last Year: Never true  Transportation Needs: No Transportation Needs (07/29/2023)    Received from [facility] - Transportation     Lack of Transportation (Medical): No     Lack of Transportation (Non-Medical): No  Physical Activity: Not on file  Stress: No Stress Concern Present (09/14/2022)    Received from [facility] Occupational [facility] - Occupational Stress Questionnaire     Feeling of Stress : Only a little  Social Connections: Unknown (11/23/2021)    Received from [facility], [facility]    Social Network     Social Network: Not on file  Intimate Partner Violence: Unknown (10/15/2021)    Received from [facility], [facility]    HITS     Physically Hurt: Not on file     Insult or Talk Down To: Not on file     Threaten Physical Harm: Not on file     Scream or Curse: Not on file             Family History  Problem Relation Age of Onset   Healthy Mother     Healthy Father            Vitals:   04/04/24 1040  BP: 105/67  Pulse: 64  SpO2: 100%    Wt Readings from Last 3 Encounters:  04/04/24 224 lb (101.6 kg)  01/12/24 214 lb (97.1 kg)  12/16/23 217 lb 3.2 oz (98.5 kg)      PHYSICAL EXAM: General: Obese woman sitting in chair No resp difficulty HEENT: normal Neck: supple. JVP hard to see. Carotids 2+ bilat; no bruits. No lymphadenopathy or thryomegaly appreciated. Cor: PMI nondisplaced. Regular rate & rhythm. No rubs, gallops or murmurs. Lungs: clear Abdomen:obese  soft, nontender, nondistended. No hepatosplenomegaly. No bruits or masses. Good bowel sounds. Extremities: no cyanosis, clubbing, rash, edema Neuro: alert & orientedx3, cranial nerves grossly intact. moves all 4 extremities w/o difficulty. Affect pleasant    ASSESSMENT & PLAN:   1. Pulmonary HTN - Echo 2/24 EF 45% RV mod reduced  Mod TR.  - R/L cath 3/24 No significant CAD.RA 20 PA 98/38 (59) PCWP 32 (LVEDP 24) Fick 4.3/2.1 PVR 6.3 PaPi 3.5 c/w mixed pre-and post-capillary  HTN  - Echo 2/25 EF 60-65% RV severely reduced RVSP 85 Mild-mod TR - Repeat RHC 3/25 RA 15, PAP 94/36, PCW 12, FICK 4.5/23. 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 - Has seen Pulmonary (Hunsucker) - -PFTs 5/25 with normal DLCO and evidence of obstruction with BD responsiveness Flattened inspiratory curve raises suspicion for vocal cord dysfunction  - Will hold off on VQ with low suspicion for CTEPH - Stable NYHA II-III - Volume looks good - Sleep study AHI 38 O2 sats down to 63% (follows with Dr. Shlomo) - now on CPAP x 2 months - Will repeat RHC to reassess hemodynamics and consider careful introduction PAH meds if numbers qualify being mindful of WHO group II/III components - Encouraged ongoing attempts at weight loss  2. Chronic diastolic HF  - Echo 2/24 EF 45% (suspect EF underestimated due to RV compression and abnormal septal movement) - Echo 3/25 EF 60-65% - NYHA II-III. Volume ok  - Continue lisinopril  20 bid - Continue spiro 12.5 - Continue carvedilol  6.25 bid - can decrease as needed.  - Continue Farxiga  - Plan to repeat RHC   3. Morbid obesity -Body mass index is 40.97 kg/m. - needs GLP1RA but hasn't qualified  4. OSA, severe - Sleep study AHI 38 O2 sats down to 63% (follows with Dr. Shlomo) -  - Started CPAP 7/25  5. HTN - Blood pressure well controlled. Continue current regimen.  Toribio Fuel, MD

## 2024-04-04 NOTE — Progress Notes (Signed)
 [facility]  NOTE   Referring Physician: Sophronia Ozell BROCKS, MD Primary Care: Sophronia Ozell BROCKS, MD Primary Cardiologist: None   HPI:   Jaclyn Klein is a 58 y/o woman with morbid obesity, HTN referred by Dr. Uvaldo for further evaluation of pulmonary HTN.   I saw her in 4/24 for the first visit. Denied h/o known cardiac disease prior to recent diagnosis   Has been overweight for a long time. Max weight 307. In 2021 lost 97 pounds with diet. Regained 20 pounds. Was on Phen-Phen for several years. Has been told she snores heavily. Not tested for OSA.    Began to feel SOB in 12/23. Got progressively worse to where she was SOB with any activity. + LE edema.     Echo 2/24 EF 45% RV mod reduced Mod TR. Then underwent L/RHC om 09/14/22 at [facility]   No significant CAD. RA 20 RV 94/23 PA 98/38 (59)  PCWP 32 (LVEDP 24)  AO sat 91% PA sat 60% Fick 4.3/2.1  PVR 6.3 PaPi 3.55  Echo 2/25 EF 60-65% RV severely reduced RVSP 85 Mild-mod TR  RHC 3/25 which was c/w severe PAH w/ evidence of cor pulmonale (see below).   Sleep study AHI 38 O2 sats down to 63% (follows with Dr. Shlomo) - had sleep study titration last week. Has not rec'd device yet   PFTs 12/09/23 FEV1 1.74 (65%) FVC 2.71 (80%) FEV1/FVC 64% DLCO 93%   Post BD FEV1 2.15 Ratio 81%  RHC Findings 09/27/23:   RA = 15 RV = 92/17 PA = 94/36 (56) PCW = 12 Fick cardiac output/index = 4.5/2.3 Thermo CO/CI = 3.6/1.8 PVR = 12.3 WU (Fick) 15.4 (TD) Ao sat = 92% PA sat = 63%, 66% PAPi = 3.5   She returns to clinic for f/u today with her daughter. Been on CPAP x 2 months. Hates it. Couldn't get Mounjaro approved. Gained 5-10 pounds from stress eating. Still SOB with mild activity. No edema, orthopnea or PND. Has not been on sildenafil     Current Outpatient Medications  Medication Instructions   albuterol  (VENTOLIN  HFA) 108 (90 Base) MCG/ACT inhaler 2 puffs, Inhalation, Every 6 hours PRN   ALPRAZolam (XANAX) 0.5 mg, 2 times  daily PRN   atorvastatin  (LIPITOR) 80 mg, Daily   busPIRone (BUSPAR) 10 mg, 2 times daily   carvedilol  (COREG ) 6.25 mg, Oral, 2 times daily with meals   cholecalciferol (VITAMIN D3) 5,000 Units, Daily   diphenhydramine-acetaminophen  (TYLENOL  PM) 25-500 MG TABS tablet 2 tablets, Daily at bedtime   docusate sodium (COLACE) 100 mg, Daily at bedtime   empagliflozin  (JARDIANCE ) 10 mg, Oral, Daily before breakfast   esomeprazole (NEXIUM) 40 mg, Daily   furosemide  (LASIX ) 40 mg, Oral, Daily   gabapentin (NEURONTIN) 100 mg, 3 times daily PRN   ibuprofen  (ADVIL ) 800 mg, Every 6 hours PRN   levothyroxine  (SYNTHROID ) 100 mcg, Daily before breakfast   lisinopril  (ZESTRIL ) 40 mg, Oral, Daily   spironolactone  (ALDACTONE ) 25 MG tablet Take 1/2 (one-half) tablet by mouth once daily   traZODone (DESYREL) 25-50 mg, At bedtime PRN   Vitamin D (Ergocalciferol) (DRISDOL) 50,000 Units, Every 7 days       Allergies  No Known Allergies       Social History         Socioeconomic History   Marital status: Married      Spouse name: Not on file   Number of children: Not on file   Years of education: Not  on file   Highest education level: Not on file  Occupational History   Not on file  Tobacco Use   Smoking status: Never   Smokeless tobacco: Never  Vaping Use   Vaping status: Never Used  Substance and Sexual Activity   Alcohol use: Yes      Alcohol/week: 14.0 standard drinks of alcohol      Types: 14 Glasses of wine per week      Comment: 2 glasses to bottle a day   Drug use: Never   Sexual activity: Yes  Other Topics Concern   Not on file  Social History Narrative   Not on file    Social Drivers of Health        Financial Resource Strain: Low Risk  (07/29/2023)    Received from [facility]    Overall Financial Resource Strain (CARDIA)     Difficulty of Paying Living Expenses: Not hard at all  Food Insecurity: No Food Insecurity (07/29/2023)    Received from [facility]    Hunger  Vital Sign     Worried About Running Out of Food in the Last Year: Never true     Ran Out of Food in the Last Year: Never true  Transportation Needs: No Transportation Needs (07/29/2023)    Received from [facility] - Transportation     Lack of Transportation (Medical): No     Lack of Transportation (Non-Medical): No  Physical Activity: Not on file  Stress: No Stress Concern Present (09/14/2022)    Received from [facility], [facility] of Occupational Health - Occupational Stress Questionnaire     Feeling of Stress : Only a little  Social Connections: Unknown (11/23/2021)    Received from [facility], [facility]    Social Network     Social Network: Not on file  Intimate Partner Violence: Unknown (10/15/2021)    Received from [facility], [facility]    HITS     Physically Hurt: Not on file     Insult or Talk Down To: Not on file     Threaten Physical Harm: Not on file     Scream or Curse: Not on file             Family History  Problem Relation Age of Onset   Healthy Mother     Healthy Father            Vitals:   04/04/24 1040  BP: 105/67  Pulse: 64  SpO2: 100%    Wt Readings from Last 3 Encounters:  04/04/24 224 lb (101.6 kg)  01/12/24 214 lb (97.1 kg)  12/16/23 217 lb 3.2 oz (98.5 kg)      PHYSICAL EXAM: General: Obese woman sitting in chair No resp difficulty HEENT: normal Neck: supple. JVP hard to see. Carotids 2+ bilat; no bruits. No lymphadenopathy or thryomegaly appreciated. Cor: PMI nondisplaced. Regular rate & rhythm. No rubs, gallops or murmurs. Lungs: clear Abdomen:obese  soft, nontender, nondistended. No hepatosplenomegaly. No bruits or masses. Good bowel sounds. Extremities: no cyanosis, clubbing, rash, edema Neuro: alert & orientedx3, cranial nerves grossly intact. moves all 4 extremities w/o difficulty. Affect pleasant    ASSESSMENT & PLAN:   1. Pulmonary HTN - Echo 2/24 EF 45% RV mod reduced  Mod TR.  - R/L cath 3/24 No significant CAD.RA 20 PA 98/38 (59) PCWP 32 (LVEDP 24) Fick 4.3/2.1 PVR 6.3 PaPi 3.5 c/w mixed pre-and post-capillary  HTN  - Echo 2/25 EF 60-65% RV severely reduced RVSP 85 Mild-mod TR - Repeat RHC 3/25 RA 15, PAP 94/36, PCW 12, FICK 4.5/23. 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 - Has seen Pulmonary ([facility]ucker) - -PFTs 5/25 with normal DLCO and evidence of obstruction with BD responsiveness Flattened inspiratory curve raises suspicion for vocal cord dysfunction  - Will hold off on VQ with low suspicion for CTEPH - Stable NYHA II-III - Volume looks good - Sleep study AHI 38 O2 sats down to 63% (follows with Dr. Shlomo) - now on CPAP x 2 months - Will repeat RHC to reassess hemodynamics and consider careful introduction PAH meds if numbers qualify being mindful of WHO group II/III components - Encouraged ongoing attempts at weight loss  2. Chronic diastolic HF  - Echo 2/24 EF 45% (suspect EF underestimated due to RV compression and abnormal septal movement) - Echo 3/25 EF 60-65% - NYHA II-III. Volume ok  - Continue lisinopril  20 bid - Continue spiro 12.5 - Continue carvedilol  6.25 bid - can decrease as needed.  - Continue Farxiga  - Plan to repeat RHC   3. Morbid obesity -Body mass index is 40.97 kg/m. - needs GLP1RA but hasn't qualified  4. OSA, severe - Sleep study AHI 38 O2 sats down to 63% (follows with Dr. Shlomo) -  - Started CPAP 7/25  5. HTN - Blood pressure well controlled. Continue current regimen.  Toribio Fuel, MD

## 2024-04-04 NOTE — Patient Instructions (Addendum)
 Medication Changes:  No Changes In Medications at this time.   Lab Work:  Go downstairs to [place] on LOWER LEVEL to have your blood work completed.  We will only call you if the results are abnormal or if the provider would like to make medication changes.  No news is good news.   Testing/Procedures:   [facility] [address] [place] [address] Dept: [phone]  Jaclyn Klein  04/04/2024  You are scheduled for a Cardiac Catheterization on Thursday, October 2 with Dr. Toribio Fuel.  1. Please arrive at the [facility] (Main Entrance A) at [facility]: [address] [place], [place] [address] at 9am (This time is 2 hour(s) before your procedure to ensure your preparation).   Free valet parking service is available. You will check in at ADMITTING. The support person will be asked to wait in the waiting room.  It is OK to have someone drop you off and come back when you are ready to be discharged.    Special note: Every effort is made to have your procedure done on time. Please understand that emergencies sometimes delay scheduled procedures.  2. Diet: NPO: Nothing to eat OR drink after midnight. (For TEE and Cath the same day)   3. Hydration: Nothing to eat and drink after midnight. (For TEE and Cath the same day)  4. Labs: You will need to have blood drawn on TODAY   5. Medication instructions in preparation for your procedure:   Contrast Allergy: No  DO NOT TAKE JARDIANCE  THE MORNING OF PROCEDURE   DO NOT TAKE FUROSEMIDE  THE MORNING OF PROCEDURE   6. Plan to go home the same day, you will only stay overnight if medically necessary. 7. Bring a current list of your medications and current insurance cards. 8. You MUST have a responsible person to drive you home. 9. Someone MUST be with you the first 24 hours after you arrive home or your discharge will be  delayed. 10. Please wear clothes that are easy to get on and off and wear slip-on shoes.  Thank you for allowing us  to care for you!   -- [facility]  Follow-Up in: 2 MONTHS AS SCHEDULED    Thank you for choosing [facility].    At the [facility], you and your health needs are our priority. We have a designated team specialized in the treatment of Heart Failure. This Care Team includes your primary Heart Failure Specialized Cardiologist (physician), Advanced Practice Providers (APPs- Physician Assistants and Nurse Practitioners), and Pharmacist who all work together to provide you with the care you need, when you need it.   You may see any of the following providers on your designated Care Team at your next follow up:  Dr. Toribio Fuel Dr. Ezra Shuck Dr. Ria Commander Dr. Morene Brownie Ellouise Class, FNP Jaun Bash, RPH-CPP  Please be sure to bring in all your medications bottles to every appointment.   Need to Contact Us :  If you have any questions or concerns before your next appointment please send us  a message through [place] or call our office at [phone].    TO LEAVE A MESSAGE FOR THE NURSE SELECT OPTION 2, PLEASE LEAVE A MESSAGE INCLUDING: YOUR NAME DATE OF BIRTH CALL BACK NUMBER REASON FOR CALL**this is important as we prioritize the call backs  YOU WILL  RECEIVE A CALL BACK THE SAME DAY AS LONG AS YOU CALL BEFORE 4:00 PM

## 2024-04-05 LAB — CBC
Hematocrit: 48.7 % — ABNORMAL HIGH (ref 34.0–46.6)
Hemoglobin: 15.7 g/dL (ref 11.1–15.9)
MCH: 31 pg (ref 26.6–33.0)
MCHC: 32.2 g/dL (ref 31.5–35.7)
MCV: 96 fL (ref 79–97)
Platelets: 136 x10E3/uL — ABNORMAL LOW (ref 150–450)
RBC: 5.07 x10E6/uL (ref 3.77–5.28)
RDW: 12.9 % (ref 11.7–15.4)
WBC: 7.4 x10E3/uL (ref 3.4–10.8)

## 2024-04-05 LAB — BASIC METABOLIC PANEL WITH GFR
BUN/Creatinine Ratio: 25 — ABNORMAL HIGH (ref 9–23)
BUN: 33 mg/dL — ABNORMAL HIGH (ref 6–24)
CO2: 19 mmol/L — ABNORMAL LOW (ref 20–29)
Calcium: 9.7 mg/dL (ref 8.7–10.2)
Chloride: 102 mmol/L (ref 96–106)
Creatinine, Ser: 1.33 mg/dL — ABNORMAL HIGH (ref 0.57–1.00)
Glucose: 75 mg/dL (ref 70–99)
Potassium: 4.4 mmol/L (ref 3.5–5.2)
Sodium: 140 mmol/L (ref 134–144)
eGFR: 46 mL/min/1.73 — ABNORMAL LOW (ref >59)

## 2024-04-11 ENCOUNTER — Other Ambulatory Visit: Payer: Self-pay | Admitting: Internal Medicine

## 2024-04-13 ENCOUNTER — Other Ambulatory Visit: Payer: Self-pay

## 2024-04-13 ENCOUNTER — Ambulatory Visit (HOSPITAL_COMMUNITY)
Admission: RE | Admit: 2024-04-13 | Discharge: 2024-04-13 | Disposition: A | Discharge status: Home / Self Care | Attending: Internal Medicine | Admitting: Internal Medicine

## 2024-04-13 ENCOUNTER — Other Ambulatory Visit (HOSPITAL_COMMUNITY): Payer: Self-pay

## 2024-04-13 ENCOUNTER — Encounter (HOSPITAL_COMMUNITY)
Admission: RE | Disposition: A | Discharge status: Home / Self Care | Payer: Self-pay | Source: Home / Self Care | Attending: Internal Medicine

## 2024-04-13 ENCOUNTER — Telehealth (HOSPITAL_COMMUNITY): Payer: Self-pay

## 2024-04-13 DIAGNOSIS — I5032 Chronic diastolic (congestive) heart failure: Secondary | ICD-10-CM | POA: Diagnosis not present

## 2024-04-13 DIAGNOSIS — I110 Hypertensive heart disease with heart failure: Secondary | ICD-10-CM | POA: Diagnosis not present

## 2024-04-13 DIAGNOSIS — G4733 Obstructive sleep apnea (adult) (pediatric): Secondary | ICD-10-CM | POA: Diagnosis not present

## 2024-04-13 DIAGNOSIS — I270 Primary pulmonary hypertension: Secondary | ICD-10-CM

## 2024-04-13 DIAGNOSIS — R0602 Shortness of breath: Secondary | ICD-10-CM | POA: Diagnosis not present

## 2024-04-13 DIAGNOSIS — Z79899 Other long term (current) drug therapy: Secondary | ICD-10-CM | POA: Diagnosis not present

## 2024-04-13 DIAGNOSIS — I440 Atrioventricular block, first degree: Secondary | ICD-10-CM | POA: Insufficient documentation

## 2024-04-13 DIAGNOSIS — Z6841 Body Mass Index (BMI) 40.0 and over, adult: Secondary | ICD-10-CM | POA: Insufficient documentation

## 2024-04-13 DIAGNOSIS — I2729 Other secondary pulmonary hypertension: Secondary | ICD-10-CM | POA: Diagnosis present

## 2024-04-13 HISTORY — PX: RIGHT HEART CATH: CATH118263

## 2024-04-13 LAB — POCT I-STAT EG7
Acid-base deficit: 3 mmol/L — ABNORMAL HIGH (ref 0.0–2.0)
Acid-base deficit: 3 mmol/L — ABNORMAL HIGH (ref 0.0–2.0)
Acid-base deficit: 3 mmol/L — ABNORMAL HIGH (ref 0.0–2.0)
Bicarbonate: 21.9 mmol/L (ref 20.0–28.0)
Bicarbonate: 22.2 mmol/L (ref 20.0–28.0)
Bicarbonate: 22.3 mmol/L (ref 20.0–28.0)
Calcium, Ion: 1.13 mmol/L — ABNORMAL LOW (ref 1.15–1.40)
Calcium, Ion: 1.14 mmol/L — ABNORMAL LOW (ref 1.15–1.40)
Calcium, Ion: 1.22 mmol/L (ref 1.15–1.40)
HCT: 38 % (ref 36.0–46.0)
HCT: 39 % (ref 36.0–46.0)
HCT: 40 % (ref 36.0–46.0)
Hemoglobin: 12.9 g/dL (ref 12.0–15.0)
Hemoglobin: 13.3 g/dL (ref 12.0–15.0)
Hemoglobin: 13.6 g/dL (ref 12.0–15.0)
O2 Saturation: 69 %
O2 Saturation: 69 %
O2 Saturation: 70 %
Potassium: 3.7 mmol/L (ref 3.5–5.1)
Potassium: 3.7 mmol/L (ref 3.5–5.1)
Potassium: 3.9 mmol/L (ref 3.5–5.1)
Sodium: 139 mmol/L (ref 135–145)
Sodium: 141 mmol/L (ref 135–145)
Sodium: 141 mmol/L (ref 135–145)
TCO2: 23 mmol/L (ref 22–32)
TCO2: 23 mmol/L (ref 22–32)
TCO2: 23 mmol/L (ref 22–32)
pCO2, Ven: 39 mmHg — ABNORMAL LOW (ref 44–60)
pCO2, Ven: 39.4 mmHg — ABNORMAL LOW (ref 44–60)
pCO2, Ven: 39.5 mmHg — ABNORMAL LOW (ref 44–60)
pH, Ven: 7.351 (ref 7.25–7.43)
pH, Ven: 7.36 (ref 7.25–7.43)
pH, Ven: 7.363 (ref 7.25–7.43)
pO2, Ven: 38 mmHg (ref 32–45)
pO2, Ven: 38 mmHg (ref 32–45)
pO2, Ven: 38 mmHg (ref 32–45)

## 2024-04-13 SURGERY — RIGHT HEART CATH
Anesthesia: LOCAL

## 2024-04-13 MED ORDER — TADALAFIL (PAH) 20 MG PO TABS: 40.0000 mg | ORAL_TABLET | Freq: Every day | ORAL | 3 refills | Status: AC

## 2024-04-13 MED ORDER — ONDANSETRON HCL 4 MG/2ML IJ SOLN: 4.0000 mg | Freq: Four times a day (QID) | INTRAMUSCULAR | Status: DC | PRN

## 2024-04-13 MED ORDER — HEPARIN (PORCINE) IN NACL 1000-0.9 UT/500ML-% IV SOLN
INTRAVENOUS | Status: DC | PRN
  Administered 2024-04-13: 500 mL

## 2024-04-13 MED ORDER — SODIUM CHLORIDE 0.9% FLUSH: 3.0000 mL | INTRAVENOUS | Status: DC | PRN

## 2024-04-13 MED ORDER — HYDRALAZINE HCL 20 MG/ML IJ SOLN: 10.0000 mg | INTRAMUSCULAR | Status: DC | PRN

## 2024-04-13 MED ORDER — SODIUM CHLORIDE 0.9 % IV SOLN: 250.0000 mL | INTRAVENOUS | Status: DC | PRN

## 2024-04-13 MED ORDER — LIDOCAINE HCL (PF) 1 % IJ SOLN
INTRAMUSCULAR | Status: DC | PRN
  Administered 2024-04-13: 2 mL

## 2024-04-13 MED ORDER — TADALAFIL (PAH) 20 MG PO TABS: 40.0000 mg | ORAL_TABLET | Freq: Every day | ORAL | 3 refills | Status: DC

## 2024-04-13 MED ORDER — LIDOCAINE HCL (PF) 1 % IJ SOLN
INTRAMUSCULAR | Status: AC
  Filled 2024-04-13: qty 30

## 2024-04-13 MED ORDER — SODIUM CHLORIDE 0.9% FLUSH
3.0000 mL | Freq: Two times a day (BID) | INTRAVENOUS | Status: DC
Start: 2024-04-13 — End: 2024-04-13

## 2024-04-13 MED ORDER — SODIUM CHLORIDE 0.9% FLUSH: 3.0000 mL | Freq: Two times a day (BID) | INTRAVENOUS | Status: DC

## 2024-04-13 MED ORDER — ACETAMINOPHEN 325 MG PO TABS: 650.0000 mg | ORAL_TABLET | ORAL | Status: DC | PRN

## 2024-04-13 MED ORDER — LABETALOL HCL 5 MG/ML IV SOLN: 10.0000 mg | INTRAVENOUS | Status: DC | PRN

## 2024-04-13 SURGICAL SUPPLY — 5 items
CATH SWAN GANZ 7F STRAIGHT (CATHETERS) IMPLANT
GLIDESHEATH SLENDER 7FR .021G (SHEATH) IMPLANT
PACK CARDIAC CATHETERIZATION (CUSTOM PROCEDURE TRAY) ×1 IMPLANT
TRANSDUCER W/STOPCOCK (MISCELLANEOUS) IMPLANT
TUBING ART PRESS 72 MALE/FEM (TUBING) IMPLANT

## 2024-04-13 NOTE — Interval H&P Note (Signed)
 History and Physical Interval Note:  04/13/2024 11:30 AM  Jaclyn Klein  has presented today for surgery, with the diagnosis of pulmonary hypertension.  The various methods of treatment have been discussed with the patient and family. After consideration of risks, benefits and other options for treatment, the patient has consented to  Procedure(s): RIGHT HEART CATH (N/A) as a surgical intervention.  The patient's history has been reviewed, patient examined, no change in status, stable for surgery.  I have reviewed the patient's chart and labs.  Questions were answered to the patient's satisfaction.     [name]

## 2024-04-13 NOTE — Telephone Encounter (Signed)
 Advanced Heart Failure Patient Advocate Encounter  Prior authorization for Tadalafil [facility]) has been submitted and approved. Test billing returns rejection - must be filled with specialty pharamcy  Key: [id number] Effective: 04/13/2024 to 04/13/2025  Rachel DEL, CPhT Rx Patient Advocate Phone: [phone]

## 2024-04-13 NOTE — Progress Notes (Signed)
 Patient and family given discharge instructions, no further questions at this time. MD Bensimhon called in regards to lower blood pressures, asked to give patient some more PO intake and discharge patient afterwards. Patient drank 500 ml of water and ate some crackers. Blood pressure increased, able to ambulated without feeling dizzy and able to void as well.

## 2024-04-13 NOTE — Discharge Instructions (Addendum)

## 2024-04-14 ENCOUNTER — Encounter (HOSPITAL_COMMUNITY): Payer: Self-pay | Admitting: Internal Medicine

## 2024-05-02 ENCOUNTER — Other Ambulatory Visit: Payer: Self-pay | Admitting: Internal Medicine

## 2024-05-30 ENCOUNTER — Encounter: Admitting: Internal Medicine

## 2024-06-14 ENCOUNTER — Telehealth (HOSPITAL_COMMUNITY): Payer: Self-pay

## 2024-06-14 NOTE — Telephone Encounter (Signed)
 Patient called requesting status of sleep apnea paperwork; she stated it was given to you at her [facility] appointment. Please advise.

## 2024-06-19 NOTE — Telephone Encounter (Signed)
 It was left at [facility] for staff there. Will forward to check on this.

## 2024-06-21 NOTE — Progress Notes (Signed)
 [facility]  NOTE   Referring Physician: Sophronia Ozell BROCKS, MD Primary Care: Sophronia Ozell BROCKS, MD Primary Cardiologist: None   HPI:   Jaclyn Klein is a 58 y/o woman with morbid obesity, HTN referred by Dr. Uvaldo for further evaluation of pulmonary HTN.   I saw her in 4/24 for the first visit. Denied h/o known cardiac disease prior to recent diagnosis   Has been overweight for a long time. Max weight 307. In 2021 lost 97 pounds with diet. Regained 20 pounds. Was on Phen-Phen for several years. Has been told she snores heavily. Not tested for OSA.    Began to feel SOB in 12/23. Got progressively worse to where she was SOB with any activity. + LE edema.     Echo 2/24 EF 45% RV mod reduced Mod TR. Then underwent L/RHC om 09/14/22 at [facility]   No significant CAD. RA 20 RV 94/23 PA 98/38 (59)  PCWP 32 (LVEDP 24)  AO sat 91% PA sat 60% Fick 4.3/2.1  PVR 6.3 PaPi 3.55  Echo 2/25 EF 60-65% RV severely reduced RVSP 85 Mild-mod TR  RHC 3/25 which was c/w severe PAH w/ evidence of cor pulmonale (see below).   Sleep study AHI 38 O2 sats down to 63% (follows with Dr. Shlomo) - had sleep study titration last week. Has not rec'd device yet   PFTs 12/09/23 FEV1 1.74 (65%) FVC 2.71 (80%) FEV1/FVC 64% DLCO 93%   Post BD FEV1 2.15 Ratio 81%  RHC Findings 09/27/23:   RA = 15 RV = 92/17 PA = 94/36 (56) PCW = 12 Fick cardiac output/index = 4.5/2.3 Thermo CO/CI = 3.6/1.8 PVR = 12.3 WU (Fick) 15.4 (TD) Ao sat = 92% PA sat = 63%, 66% PAPi = 3.5   RHC 10/25  RA = 15 RV = 94/15 PA = 102/31 (55) PCW = 7 Fick cardiac output/index = 6.3/3.1 TC CO/CI = 4.3/2.1 PVR = 7.7 (Fick) 11.2 (TD) Ao sat = 90% PA sat = 69%, 70% SVC sat = 69% PAPi =4.7  She returns to clinic for f/u today with her daughter. Feeling much better. Wearing CPAP regularly. Tolerating tadalafil . Breathing better More active. No edema, orthopnea or PND.     Current Outpatient Medications  Medication  Instructions   albuterol  (VENTOLIN  HFA) 108 (90 Base) MCG/ACT inhaler 2 puffs, Inhalation, Every 6 hours PRN   ALPRAZolam (XANAX) 0.5 mg, Daily at bedtime   atorvastatin  (LIPITOR) 80 mg, Daily   busPIRone (BUSPAR) 15 mg, Daily   carvedilol  (COREG ) 6.25 mg, Oral, 2 times daily with meals   cholecalciferol (VITAMIN D3) 5,000 Units, Daily   dapagliflozin  propanediol (FARXIGA ) 10 MG TABS tablet Daily   docusate sodium (COLACE) 100 mg, Daily at bedtime   empagliflozin  (JARDIANCE ) 10 mg, Oral, Daily before breakfast   escitalopram (LEXAPRO) 10 mg, Daily   esomeprazole (NEXIUM) 40 mg, Daily   furosemide  (LASIX ) 40 mg, Oral, Daily   ibuprofen  (ADVIL ) 800 mg, Every 6 hours PRN   levothyroxine  (SYNTHROID ) 100 mcg, Daily before breakfast   lisinopril  (ZESTRIL ) 40 mg, Oral, Daily   spironolactone  (ALDACTONE ) 25 MG tablet Take 1/2 (one-half) tablet by mouth once daily   tadalafil  (PAH) (ADCIRCA ) 40 mg, Oral, Daily     Allergies  No Known Allergies       Social History         Socioeconomic History   Marital status: Married      Spouse name: Not on file   Number of children:  Not on file   Years of education: Not on file   Highest education level: Not on file  Occupational History   Not on file  Tobacco Use   Smoking status: Never   Smokeless tobacco: Never  Vaping Use   Vaping status: Never Used  Substance and Sexual Activity   Alcohol use: Yes      Alcohol/week: 14.0 standard drinks of alcohol      Types: 14 Glasses of wine per week      Comment: 2 glasses to bottle a day   Drug use: Never   Sexual activity: Yes  Other Topics Concern   Not on file  Social History Narrative   Not on file    Social Drivers of Health        Financial Resource Strain: Low Risk  (07/29/2023)    Received from [facility]    Overall Financial Resource Strain (CARDIA)     Difficulty of Paying Living Expenses: Not hard at all  Food Insecurity: No Food Insecurity (07/29/2023)    Received from  [facility]    Hunger Vital Sign     Worried About Running Out of Food in the Last Year: Never true     Ran Out of Food in the Last Year: Never true  Transportation Needs: No Transportation Needs (07/29/2023)    Received from [facility] - Transportation     Lack of Transportation (Medical): No     Lack of Transportation (Non-Medical): No  Physical Activity: Not on file  Stress: No Stress Concern Present (09/14/2022)    Received from [facility], [facility] - Occupational Stress Questionnaire     Feeling of Stress : Only a little  Social Connections: Unknown (11/23/2021)    Received from [facility], [facility]    Social Network     Social Network: Not on file  Intimate Partner Violence: Unknown (10/15/2021)    Received from [facility], [facility]    HITS     Physically Hurt: Not on file     Insult or Talk Down To: Not on file     Threaten Physical Harm: Not on file     Scream or Curse: Not on file             Family History  Problem Relation Age of Onset   Healthy Mother     Healthy Father            Vitals:   06/22/24 0930  BP: 116/70  Pulse: (!) 56  SpO2: 94%     Wt Readings from Last 3 Encounters:  06/22/24 101.2 kg (223 lb)  04/13/24 100.7 kg (222 lb)  04/04/24 101.6 kg (224 lb)      PHYSICAL EXAM: General:  Sitting up. No resp difficulty HEENT: normal Neck: supple. no JVD.  Cor: Regular rate & rhythm. No rubs, gallops or murmurs. Lungs: clear Abdomen: obese soft, nontender, nondistended.Good bowel sounds. Extremities: no cyanosis, clubbing, rash, edema Neuro: alert & orientedx3, cranial nerves grossly intact. moves all 4 extremities w/o difficulty. Affect pleasant    ASSESSMENT & PLAN:   1. Pulmonary HTN - Echo 2/24 EF 45% RV mod reduced Mod TR.  - R/L cath 3/24 No significant CAD.RA 20 PA 98/38 (59) PCWP 32 (LVEDP 24) Fick 4.3/2.1 PVR 6.3 PaPi 3.5 c/w mixed pre-and  post-capillary HTN  - Echo 2/25 EF 60-65% RV severely reduced RVSP 85 Mild-mod  TR - Repeat RHC 3/25 RA 15, PAP 94/36, PCW 12, FICK 4.5/23. 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 - Has seen Pulmonary ([place]unsucker) - -PFTs 5/25 with normal DLCO and evidence of obstruction with BD responsiveness Flattened inspiratory curve raises suspicion for vocal cord dysfunction  - Will hold off on VQ with low suspicion for CTEPH - Improved NYHA II - Volume looks good - Sleep study AHI 38 O2 sats down to 63% (follows with Dr. Shlomo) - now on CPAP (see below) - Now on tadalafil  with improvement. Add macitentan - today  - Repeat RHC in 3 months  - Reveal Lite risk score - need BNP and today  2. Chronic diastolic HF  - Echo 2/24 EF 45% (suspect EF underestimated due to RV compression and abnormal septal movement) - Echo 3/25 EF 60-65% - NYHA II Volume ok  - Continue lisinopril  20 bid - Continue spiro 12.5 - Continue carvedilol  6.25 bid - can decrease as needed.  - Continue Farxiga    3. Morbid obesity -Body mass index is 38.28 kg/m. - needs GLP1RA but hasn't qualified  4. OSA, severe - Sleep study AHI 38 O2 sats down to 63% (follows with Dr. Shlomo) -  - Started CPAP 7/25 - She is using CPAP regularly. Very compliant. - Has seen significant benefit in symptoms - Encouraged her to continue   5. HTN -Blood pressure well controlled. Continue current regimen.   Toribio Fuel, MD

## 2024-06-22 ENCOUNTER — Ambulatory Visit (HOSPITAL_COMMUNITY)
Admission: RE | Admit: 2024-06-22 | Discharge: 2024-06-22 | Disposition: A | Discharge status: Home / Self Care | Source: Ambulatory Visit | Attending: Internal Medicine | Admitting: Internal Medicine

## 2024-06-22 ENCOUNTER — Encounter (HOSPITAL_COMMUNITY): Payer: Self-pay | Admitting: Internal Medicine

## 2024-06-22 ENCOUNTER — Telehealth (HOSPITAL_COMMUNITY): Payer: Self-pay

## 2024-06-22 ENCOUNTER — Other Ambulatory Visit (HOSPITAL_COMMUNITY): Payer: Self-pay

## 2024-06-22 VITALS — BP 116/70 | HR 56 | Wt 223.0 lb

## 2024-06-22 DIAGNOSIS — G4733 Obstructive sleep apnea (adult) (pediatric): Secondary | ICD-10-CM | POA: Diagnosis not present

## 2024-06-22 DIAGNOSIS — I272 Pulmonary hypertension, unspecified: Secondary | ICD-10-CM | POA: Diagnosis not present

## 2024-06-22 DIAGNOSIS — I5032 Chronic diastolic (congestive) heart failure: Secondary | ICD-10-CM | POA: Diagnosis not present

## 2024-06-22 LAB — COMPREHENSIVE METABOLIC PANEL WITH GFR
ALT: 19 U/L (ref 0–44)
AST: 24 U/L (ref 15–41)
Albumin: 3.4 g/dL — ABNORMAL LOW (ref 3.5–5.0)
Alkaline Phosphatase: 138 U/L — ABNORMAL HIGH (ref 38–126)
Anion gap: 11 (ref 5–15)
BUN: 21 mg/dL — ABNORMAL HIGH (ref 6–20)
CO2: 22 mmol/L (ref 22–32)
Calcium: 8.6 mg/dL — ABNORMAL LOW (ref 8.9–10.3)
Chloride: 107 mmol/L (ref 98–111)
Creatinine, Ser: 0.95 mg/dL (ref 0.44–1.00)
GFR, Estimated: >60 mL/min (ref >60)
Glucose, Bld: 90 mg/dL (ref 70–99)
Potassium: 3.4 mmol/L — ABNORMAL LOW (ref 3.5–5.1)
Sodium: 140 mmol/L (ref 135–145)
Total Bilirubin: 1.7 mg/dL — ABNORMAL HIGH (ref 0.0–1.2)
Total Protein: 5.9 g/dL — ABNORMAL LOW (ref 6.5–8.1)

## 2024-06-22 LAB — CBC
HCT: 44.9 % (ref 36.0–46.0)
Hemoglobin: 15.3 g/dL — ABNORMAL HIGH (ref 12.0–15.0)
MCH: 31.2 pg (ref 26.0–34.0)
MCHC: 34.1 g/dL (ref 30.0–36.0)
MCV: 91.6 fL (ref 80.0–100.0)
Platelets: 149 K/uL — ABNORMAL LOW (ref 150–400)
RBC: 4.9 MIL/uL (ref 3.87–5.11)
RDW: 14.3 % (ref 11.5–15.5)
WBC: 5.9 K/uL (ref 4.0–10.5)
nRBC: 0 % (ref 0.0–0.2)

## 2024-06-22 LAB — BRAIN NATRIURETIC PEPTIDE: B Natriuretic Peptide: 700.9 pg/mL — ABNORMAL HIGH (ref 0.0–100.0)

## 2024-06-22 NOTE — Addendum Note (Signed)
 Encounter addended by: Tita Andriette NOVAK, RN on: 06/22/2024 10:22 AM  Actions taken: Pend clinical note, Order list changed, Diagnosis association updated, Clinical Note Signed

## 2024-06-22 NOTE — Addendum Note (Signed)
 Encounter addended by: Elinda Rolin SAILOR, [place] on: 06/22/2024 10:28 AM  Actions taken: Clinical Note Signed

## 2024-06-22 NOTE — Progress Notes (Signed)
 6 Min Walk Test Completed  Pt ambulated 1339ft (396.35m) O2 Sat ranged 94-90  HR ranged 106-67

## 2024-06-22 NOTE — Telephone Encounter (Signed)
 Advanced [facility] Failure Patient Advocate Encounter  Prior authorization for Opsynvi has been submitted and approved. Test billing returns rejection; must be filled at preferred specialty location   Key: [id number] Effective: 06/22/2024 to 06/22/2025  Rachel DEL, CPhT Rx Patient Advocate Phone: [phone]

## 2024-06-22 NOTE — Telephone Encounter (Signed)
 Advanced Heart Failure Patient Advocate Encounter  Application for Opsynvi faxed to [employer] on 06/22/2024. Application form attached to patient chart.  Rachel DEL, CPhT Rx Patient Advocate Phone: [phone]

## 2024-06-22 NOTE — Patient Instructions (Addendum)
 Medication Changes:  STOP TADALAFIL    START OPSYNVI 10/40mg  ONCE DAILY  PHARMACY WILL REACH OUT REGARDING THIS   Lab Work:  Labs done today, your results will be available in MyChart, we will contact you for abnormal readings.  Follow-Up in: 4 MONTHS WITH AN ECHOCARDIOGRAM PLEASE CALL OUR OFFICE AROUND FEBRUARY  TO GET SCHEDULED FOR YOUR APPOINTMENT. PHONE NUMBER IS [phone] OPTION 2   At the [facility], you and your health needs are our priority. We have a designated team specialized in the treatment of Heart Failure. This Care Team includes your primary Heart Failure Specialized Cardiologist (physician), Advanced Practice Providers (APPs- Physician Assistants and Nurse Practitioners), and Pharmacist who all work together to provide you with the care you need, when you need it.   You may see any of the following providers on your designated Care Team at your next follow up:  Dr. Toribio Fuel Dr. Ezra Shuck Dr. Odis Brownie Greig Mosses, NP Caffie Shed, [place] [facility] [place], [place] Beckey Coe, NP Jordan Lee, NP Tinnie Redman, PharmD   Please be sure to bring in all your medications bottles to every appointment.   Need to Contact Us :  If you have any questions or concerns before your next appointment please send us  a message through [place] or call our office at [phone].    TO LEAVE A MESSAGE FOR THE NURSE SELECT OPTION 2, PLEASE LEAVE A MESSAGE INCLUDING: YOUR NAME DATE OF BIRTH CALL BACK NUMBER REASON FOR CALL**this is important as we prioritize the call backs  YOU WILL RECEIVE A CALL BACK THE SAME DAY AS LONG AS YOU CALL BEFORE 4:00 PM

## 2024-06-26 ENCOUNTER — Ambulatory Visit (HOSPITAL_COMMUNITY): Payer: Self-pay | Admitting: Internal Medicine

## 2024-06-26 ENCOUNTER — Telehealth (HOSPITAL_COMMUNITY): Payer: Self-pay | Admitting: *Deleted

## 2024-06-26 DIAGNOSIS — I5032 Chronic diastolic (congestive) heart failure: Secondary | ICD-10-CM

## 2024-06-26 MED ORDER — SPIRONOLACTONE 25 MG PO TABS: 25.0000 mg | ORAL_TABLET | Freq: Every day | ORAL | 3 refills | Status: AC

## 2024-06-26 MED ORDER — POTASSIUM CHLORIDE CRYS ER 20 MEQ PO TBCR: 40.0000 meq | EXTENDED_RELEASE_TABLET | Freq: Once | ORAL | 0 refills | Status: AC

## 2024-06-26 NOTE — Telephone Encounter (Signed)
 [employer] requested clarification / updates for application. Forms updated and faxed on 06/26/2024

## 2024-06-26 NOTE — Telephone Encounter (Signed)
 Called patient per Dr. Rosalynd with following:   Increase spiro to 25. Have her take kcl 40 meq x 1. Repeat BMET 2 weeks.   Pt verbalized understanding of same. Rx sent for potassium. Repeat lab ordered, pt will get at [facility].

## 2024-06-28 ENCOUNTER — Other Ambulatory Visit (HOSPITAL_COMMUNITY): Payer: Self-pay | Admitting: Internal Medicine

## 2024-06-28 DIAGNOSIS — I5032 Chronic diastolic (congestive) heart failure: Secondary | ICD-10-CM

## 2024-06-28 NOTE — Telephone Encounter (Signed)
 Received notification that voucher program was shipped to patient on 06/27/2024.

## 2024-07-04 NOTE — Telephone Encounter (Signed)
 Confirmed with patient medication has been received and started. Patient will contact me if there are issues with refills going into the new year.

## 2024-07-12 ENCOUNTER — Telehealth (HOSPITAL_COMMUNITY): Payer: Self-pay | Admitting: Cardiology

## 2024-07-12 NOTE — Telephone Encounter (Signed)
 Pt aware.

## 2024-07-12 NOTE — Telephone Encounter (Signed)
 Patients daughter called to report pt has been under the weather for the past few days.  Pt reported feeling very poorly and having chest pains last night  Reports pain is located under sternum Increase in fatigue No SOB or dizziness No fever chills body aches Last night CP 10/10 currently 4/10 which is normal per patient. Reports CP is 90% better  Patient just feels so tired and worn down does not want to get out of the bed   Please advise

## 2024-07-14 ENCOUNTER — Other Ambulatory Visit (HOSPITAL_COMMUNITY)
Admission: RE | Admit: 2024-07-14 | Discharge: 2024-07-14 | Disposition: A | Discharge status: Home / Self Care | Source: Ambulatory Visit | Attending: Internal Medicine | Admitting: Internal Medicine

## 2024-07-14 DIAGNOSIS — I5032 Chronic diastolic (congestive) heart failure: Secondary | ICD-10-CM | POA: Insufficient documentation

## 2024-07-14 LAB — BASIC METABOLIC PANEL WITH GFR
Anion gap: 12 (ref 5–15)
BUN: 17 mg/dL (ref 6–20)
CO2: 23 mmol/L (ref 22–32)
Calcium: 8.9 mg/dL (ref 8.9–10.3)
Chloride: 107 mmol/L (ref 98–111)
Creatinine, Ser: 0.83 mg/dL (ref 0.44–1.00)
GFR, Estimated: >60 mL/min
Glucose, Bld: 96 mg/dL (ref 70–99)
Potassium: 3.6 mmol/L (ref 3.5–5.1)
Sodium: 141 mmol/L (ref 135–145)

## 2024-07-24 ENCOUNTER — Other Ambulatory Visit: Payer: Self-pay | Admitting: Internal Medicine

## 2024-11-14 ENCOUNTER — Other Ambulatory Visit (HOSPITAL_COMMUNITY)

## 2024-11-14 ENCOUNTER — Ambulatory Visit (HOSPITAL_COMMUNITY): Admitting: Internal Medicine
